# Patient Record
Sex: Female | Born: 1980 | Race: White | Hispanic: Yes | Marital: Married | Smoking: Never smoker
Health system: Southern US, Community
[De-identification: ages and names within clinical notes are randomized; demographics above are authoritative.]

## PROBLEM LIST (undated history)

## (undated) DIAGNOSIS — E119 Type 2 diabetes mellitus without complications: Secondary | ICD-10-CM

## (undated) DIAGNOSIS — O139 Gestational [pregnancy-induced] hypertension without significant proteinuria, unspecified trimester: Secondary | ICD-10-CM

## (undated) DIAGNOSIS — N938 Other specified abnormal uterine and vaginal bleeding: Secondary | ICD-10-CM

## (undated) DIAGNOSIS — O24419 Gestational diabetes mellitus in pregnancy, unspecified control: Secondary | ICD-10-CM

## (undated) DIAGNOSIS — R87613 High grade squamous intraepithelial lesion on cytologic smear of cervix (HGSIL): Secondary | ICD-10-CM

## (undated) HISTORY — DX: Other specified abnormal uterine and vaginal bleeding: N93.8

## (undated) HISTORY — DX: Type 2 diabetes mellitus without complications: E11.9

## (undated) HISTORY — DX: High grade squamous intraepithelial lesion on cytologic smear of cervix (HGSIL): R87.613

## (undated) HISTORY — PX: NO PAST SURGERIES: SHX2092

---

## 2001-12-23 ENCOUNTER — Ambulatory Visit (HOSPITAL_COMMUNITY): Admission: RE | Admit: 2001-12-23 | Discharge: 2001-12-23 | Payer: Self-pay | Admitting: *Deleted

## 2001-12-23 ENCOUNTER — Encounter: Payer: Self-pay | Admitting: *Deleted

## 2001-12-24 ENCOUNTER — Inpatient Hospital Stay (HOSPITAL_COMMUNITY): Admission: AD | Admit: 2001-12-24 | Discharge: 2001-12-24 | Payer: Self-pay | Admitting: *Deleted

## 2001-12-31 ENCOUNTER — Encounter (HOSPITAL_COMMUNITY): Admission: AD | Admit: 2001-12-31 | Discharge: 2002-01-09 | Payer: Self-pay | Admitting: Obstetrics and Gynecology

## 2002-01-06 ENCOUNTER — Encounter: Payer: Self-pay | Admitting: *Deleted

## 2002-01-13 ENCOUNTER — Inpatient Hospital Stay (HOSPITAL_COMMUNITY): Admission: AD | Admit: 2002-01-13 | Discharge: 2002-01-18 | Payer: Self-pay | Admitting: *Deleted

## 2005-11-27 ENCOUNTER — Inpatient Hospital Stay (HOSPITAL_COMMUNITY): Admission: AD | Admit: 2005-11-27 | Discharge: 2005-11-30 | Payer: Self-pay | Admitting: Obstetrics

## 2006-07-14 ENCOUNTER — Emergency Department (HOSPITAL_COMMUNITY): Admission: EM | Admit: 2006-07-14 | Discharge: 2006-07-14 | Payer: Self-pay | Admitting: Emergency Medicine

## 2010-06-05 ENCOUNTER — Emergency Department (HOSPITAL_COMMUNITY)
Admission: EM | Admit: 2010-06-05 | Discharge: 2010-06-06 | Disposition: A | Discharge status: Home / Self Care | Payer: Self-pay | Attending: Emergency Medicine | Admitting: Emergency Medicine

## 2010-06-05 DIAGNOSIS — N898 Other specified noninflammatory disorders of vagina: Secondary | ICD-10-CM | POA: Insufficient documentation

## 2010-06-05 DIAGNOSIS — R109 Unspecified abdominal pain: Secondary | ICD-10-CM | POA: Insufficient documentation

## 2010-06-05 LAB — POCT I-STAT, CHEM 8
Glucose, Bld: 152 mg/dL — ABNORMAL HIGH (ref 70–99)
HCT: 39 % (ref 36.0–46.0)
Hemoglobin: 13.3 g/dL (ref 12.0–15.0)
Potassium: 3.7 mEq/L (ref 3.5–5.1)
Sodium: 139 mEq/L (ref 135–145)
TCO2: 23 mmol/L (ref 0–100)

## 2010-06-06 LAB — CBC
HCT: 36.4 % (ref 36.0–46.0)
MCH: 27.4 pg (ref 26.0–34.0)
MCHC: 33 g/dL (ref 30.0–36.0)
MCV: 83.1 fL (ref 78.0–100.0)
RDW: 14 % (ref 11.5–15.5)
WBC: 12.6 10*3/uL — ABNORMAL HIGH (ref 4.0–10.5)

## 2010-06-06 LAB — URINALYSIS, ROUTINE W REFLEX MICROSCOPIC
Nitrite: POSITIVE — AB
Protein, ur: 100 mg/dL — AB
Specific Gravity, Urine: 1.013 (ref 1.005–1.030)
Urobilinogen, UA: 0.2 mg/dL (ref 0.0–1.0)

## 2010-06-06 LAB — DIFFERENTIAL
Eosinophils Absolute: 0.2 10*3/uL (ref 0.0–0.7)
Eosinophils Relative: 2 % (ref 0–5)
Lymphocytes Relative: 22 % (ref 12–46)
Lymphs Abs: 2.8 10*3/uL (ref 0.7–4.0)
Monocytes Absolute: 0.8 10*3/uL (ref 0.1–1.0)
Monocytes Relative: 6 % (ref 3–12)

## 2010-06-06 LAB — URINE MICROSCOPIC-ADD ON

## 2010-06-06 LAB — WET PREP, GENITAL: Yeast Wet Prep HPF POC: NONE SEEN

## 2010-06-16 ENCOUNTER — Emergency Department (HOSPITAL_COMMUNITY)
Admission: EM | Admit: 2010-06-16 | Discharge: 2010-06-17 | Disposition: A | Discharge status: Home / Self Care | Payer: Self-pay | Attending: Emergency Medicine | Admitting: Emergency Medicine

## 2010-06-16 DIAGNOSIS — R109 Unspecified abdominal pain: Secondary | ICD-10-CM | POA: Insufficient documentation

## 2010-06-16 DIAGNOSIS — R42 Dizziness and giddiness: Secondary | ICD-10-CM | POA: Insufficient documentation

## 2010-06-16 DIAGNOSIS — N898 Other specified noninflammatory disorders of vagina: Secondary | ICD-10-CM | POA: Insufficient documentation

## 2010-06-16 DIAGNOSIS — M545 Low back pain, unspecified: Secondary | ICD-10-CM | POA: Insufficient documentation

## 2010-06-16 LAB — URINE MICROSCOPIC-ADD ON

## 2010-06-16 LAB — WET PREP, GENITAL
WBC, Wet Prep HPF POC: NONE SEEN
Yeast Wet Prep HPF POC: NONE SEEN

## 2010-06-16 LAB — URINALYSIS, ROUTINE W REFLEX MICROSCOPIC
Bilirubin Urine: NEGATIVE
Glucose, UA: NEGATIVE mg/dL
Protein, ur: 30 mg/dL — AB

## 2010-06-16 LAB — CBC
HCT: 36.1 % (ref 36.0–46.0)
MCH: 27.5 pg (ref 26.0–34.0)
MCV: 82.8 fL (ref 78.0–100.0)
RDW: 14.1 % (ref 11.5–15.5)
WBC: 15.9 10*3/uL — ABNORMAL HIGH (ref 4.0–10.5)

## 2010-06-17 ENCOUNTER — Emergency Department (HOSPITAL_COMMUNITY): Payer: Self-pay

## 2014-10-08 ENCOUNTER — Encounter (HOSPITAL_COMMUNITY): Payer: Self-pay | Admitting: Emergency Medicine

## 2014-10-08 ENCOUNTER — Other Ambulatory Visit (HOSPITAL_COMMUNITY)
Admission: RE | Admit: 2014-10-08 | Discharge: 2014-10-08 | Disposition: A | Discharge status: Home / Self Care | Payer: No Typology Code available for payment source | Source: Ambulatory Visit | Attending: Family Medicine | Admitting: Family Medicine

## 2014-10-08 ENCOUNTER — Emergency Department (HOSPITAL_COMMUNITY)
Admission: EM | Admit: 2014-10-08 | Discharge: 2014-10-08 | Disposition: A | Discharge status: Home / Self Care | Payer: No Typology Code available for payment source | Source: Home / Self Care

## 2014-10-08 DIAGNOSIS — N938 Other specified abnormal uterine and vaginal bleeding: Secondary | ICD-10-CM

## 2014-10-08 DIAGNOSIS — Z113 Encounter for screening for infections with a predominantly sexual mode of transmission: Secondary | ICD-10-CM | POA: Insufficient documentation

## 2014-10-08 DIAGNOSIS — N760 Acute vaginitis: Secondary | ICD-10-CM | POA: Insufficient documentation

## 2014-10-08 LAB — POCT URINALYSIS DIP (DEVICE)
Bilirubin Urine: NEGATIVE
Glucose, UA: NEGATIVE mg/dL
Ketones, ur: NEGATIVE mg/dL
Leukocytes, UA: NEGATIVE
NITRITE: NEGATIVE
PH: 8.5 — AB (ref 5.0–8.0)
PROTEIN: NEGATIVE mg/dL
Specific Gravity, Urine: 1.015 (ref 1.005–1.030)
UROBILINOGEN UA: 0.2 mg/dL (ref 0.0–1.0)

## 2014-10-08 LAB — POCT I-STAT, CHEM 8
BUN: 9 mg/dL (ref 6–20)
CALCIUM ION: 1.2 mmol/L (ref 1.12–1.23)
Chloride: 105 mmol/L (ref 101–111)
Creatinine, Ser: 0.7 mg/dL (ref 0.44–1.00)
GLUCOSE: 87 mg/dL (ref 65–99)
HEMATOCRIT: 39 % (ref 36.0–46.0)
Hemoglobin: 13.3 g/dL (ref 12.0–15.0)
POTASSIUM: 4.1 mmol/L (ref 3.5–5.1)
SODIUM: 141 mmol/L (ref 135–145)
TCO2: 24 mmol/L (ref 0–100)

## 2014-10-08 LAB — POCT PREGNANCY, URINE: PREG TEST UR: NEGATIVE

## 2014-10-08 MED ORDER — MEGESTROL ACETATE 20 MG PO TABS: ORAL_TABLET | ORAL | Status: DC

## 2014-10-08 NOTE — ED Notes (Signed)
Pt reports vaginal bleeding onset 1 month associated w/abd pain Reports she saw her PCP and was given x10 pills of Medroxyprogesterone w/no relief Denies fevers, urinary sx Alert, on signs of acute distress.

## 2014-10-08 NOTE — Discharge Instructions (Signed)
Sangrado uterino anormal °(Abnormal Uterine Bleeding) °Sangrado uterino anormal significa que hay un sangrado por la vagina que no es su período menstrual normal. Puede ser: °· Pérdidas de sangre o hemorragias entre los períodos. °· Hemorragias luego de tener sexo (relaciones sexuales). °· Sangrado abundante o más que lo habitual. °· Períodos que duran más que lo normal. °· Sangrado luego de la menopausia. °Hay muchos problemas que pueden ser la causa. El tratamiento dependerá de la causa del sangrado. Cualquier tipo de sangrado que no sea normal debe consultarse con el médico.  °CUIDADOS EN EL HOGAR °Controle su afección para ver si hay cambios. Estas indicaciones podrán disminuir cualquier molestia que tenga: °· No use tampones ni duchas vaginales o como le haya indicado el médico. °· Cambie los apósitos con frecuencia. °Deberá hacerse exámenes pélvicos regulares y pruebas de Papanicolaou. Realice los estudios indicados según le indique su médico. °SOLICITE AYUDA SI: °· El sangrado dura más de 1 semana. °· Se siente mareada por momentos. °SOLICITE AYUDA DE INMEDIATO SI:  °· Se desmaya. °· Tiene que cambiarse los apósitos cada 15 a 30 minutos. °· Siente dolor en el abdomen. °· Tiene fiebre. °· Se siente débil o presenta sudoración. °· Elimina coágulos grandes por la vagina. °· Siente malestar estomacal (náuseas) y devuelve (vomita). °ASEGÚRESE DE QUE: °· Comprende estas instrucciones. °· Controlará su afección. °· Recibirá ayuda de inmediato si no mejora o si empeora. °Document Released: 04/28/2010 Document Revised: 03/31/2013 °ExitCare® Patient Information ©2015 ExitCare, LLC. This information is not intended to replace advice given to you by your health care provider. Make sure you discuss any questions you have with your health care provider. ° °

## 2014-10-08 NOTE — ED Provider Notes (Signed)
CSN: [id number]     Arrival date & time 10/08/14  1437 History   None    Chief Complaint  Patient presents with  . Vaginal Bleeding   (Consider location/radiation/quality/duration/timing/severity/associated sxs/prior Treatment)  HPI   The patient is a non-English-speaking 34 year old female presenting tonight with complaints of persistent vaginal bleeding for the past month. Patient states she saw her primary care provider for which they provided her 10 days of methylprogesterone which was ineffective. Patient states she has some generalized lower abdominal discomfort with aching of her breasts bilaterally. Denies any fever, fatigue, nausea, vomiting, or diarrhea. Patient states she had a similar episode in 2012 for which she was hospitalized.  History reviewed. No pertinent past medical history. History reviewed. No pertinent past surgical history. No family history on file. History  Substance Use Topics  . Smoking status: Never Smoker   . Smokeless tobacco: Not on file  . Alcohol Use: No   OB History    No data available     Review of Systems  Constitutional: Negative.  Negative for fever, chills, fatigue and unexpected weight change.  HENT: Negative.   Eyes: Negative.   Respiratory: Negative.   Cardiovascular: Negative.   Gastrointestinal: Positive for abdominal pain. Negative for nausea, vomiting, diarrhea, constipation, blood in stool, abdominal distention, anal bleeding and rectal pain.  Endocrine: Negative.   Genitourinary: Positive for vaginal bleeding and pelvic pain. Negative for dysuria, frequency, flank pain, vaginal discharge, difficulty urinating, vaginal pain and dyspareunia.  Musculoskeletal: Negative.   Skin: Negative.  Negative for color change, pallor, rash and wound.  Allergic/Immunologic: Negative.   Neurological: Negative.  Negative for dizziness.  Hematological: Negative.  Does not bruise/bleed easily.  Psychiatric/Behavioral: Negative.     Allergies   Review of patient's allergies indicates no known allergies.  Home Medications   Prior to Admission medications   Medication Sig Start Date End Date Taking? Authorizing Provider  megestrol (MEGACE) 20 MG tablet Take two 20mg  tablets every morning and two 20mg  tablets every evening for uterine bleeding. (Four 20mg  tablets daily). 10/08/14   Servando Salinaatherine H Rossi, NP   BP 110/73 mmHg  Pulse 76  Temp(Src) 98.4 F (36.9 C) (Oral)  Resp 16  SpO2 100%   Physical Exam  Constitutional: She is oriented to person, place, and time. She appears well-developed and well-nourished. No distress.  Cardiovascular: Normal rate, regular rhythm, normal heart sounds and intact distal pulses.  Exam reveals no gallop and no friction rub.   No murmur heard. Pulmonary/Chest: Effort normal and breath sounds normal. No respiratory distress. She has no wheezes. She has no rales. She exhibits no tenderness.  Abdominal: Soft. Bowel sounds are normal. She exhibits no distension and no mass. There is tenderness. There is no rebound and no guarding. Hernia confirmed negative in the right inguinal area and confirmed negative in the left inguinal area.  Genitourinary: Uterus normal. There is breast tenderness. No breast swelling, discharge or bleeding. No labial fusion. There is no rash, tenderness, lesion or injury on the right labia. There is no rash, tenderness, lesion or injury on the left labia. Cervix exhibits no motion tenderness and no friability. There is bleeding in the vagina. No erythema or tenderness in the vagina. No foreign body around the vagina. No signs of injury around the vagina. No vaginal discharge found.  Moderate amount of vaginal bleeding present during speculum examination. Verified bleeding from cervical os.  Patient very tense during speculum examination and had difficulty relaxing. Negative for cervical motion  tenderness; reports mild abdominal discomfort throughout examination.  Lymphadenopathy:        Right: No inguinal adenopathy present.       Left: No inguinal adenopathy present.  Neurological: She is alert and oriented to person, place, and time.  Skin: She is not diaphoretic.  Nursing note and vitals reviewed.   ED Course  Procedures (including critical care time) Labs Review Labs Reviewed  POCT URINALYSIS DIP (DEVICE) - Abnormal; Notable for the following:    Hgb urine dipstick LARGE (*)    pH 8.5 (*)    All other components within normal limits  POCT PREGNANCY, URINE  I-STAT CHEM 8, ED  POCT I-STAT, CHEM 8  CERVICOVAGINAL ANCILLARY ONLY   Results for orders placed or performed during the hospital encounter of 10/08/14  POCT urinalysis dip (device)  Result Value Ref Range   Glucose, UA NEGATIVE NEGATIVE mg/dL   Bilirubin Urine NEGATIVE NEGATIVE   Ketones, ur NEGATIVE NEGATIVE mg/dL   Specific Gravity, Urine 1.015 1.005 - 1.030   Hgb urine dipstick LARGE (A) NEGATIVE   pH 8.5 (H) 5.0 - 8.0   Protein, ur NEGATIVE NEGATIVE mg/dL   Urobilinogen, UA 0.2 0.0 - 1.0 mg/dL   Nitrite NEGATIVE NEGATIVE   Leukocytes, UA NEGATIVE NEGATIVE  Pregnancy, urine POC  Result Value Ref Range   Preg Test, Ur NEGATIVE NEGATIVE  I-STAT, chem 8  Result Value Ref Range   Sodium 141 135 - 145 mmol/L   Potassium 4.1 3.5 - 5.1 mmol/L   Chloride 105 101 - 111 mmol/L   BUN 9 6 - 20 mg/dL   Creatinine, Ser 4.09 0.44 - 1.00 mg/dL   Glucose, Bld 87 65 - 99 mg/dL   Calcium, Ion 8.11 1.12 - 1.23 mmol/L   TCO2 24 0 - 100 mmol/L   Hemoglobin 13.3 12.0 - 15.0 g/dL   HCT 91.4 78.2 - 95.6 %    Imaging Review No results found.  Spoke with Alphonzo Severance PAC at [facility] to discuss plan of care and follow up.  Patient started on recommended  BID megace and scheduled for a follow up appointment at [facility] for 2pm on Wednesday, July 6th, 2016.  MDM   1. Dysfunctional uterine bleeding    Meds ordered this encounter  Medications  . megestrol (MEGACE) 20 MG tablet     Sig: Take two  tablets every morning and two  tablets every evening for uterine bleeding. (Four  tablets daily).    Dispense:  120 tablet    Refill:  0   Discussed plan of care with patient and importance of follow up care.  The patient verbalizes understanding and agrees to plan of care.       Servando Salina, NP 10/08/14 [phone]

## 2014-10-12 LAB — CERVICOVAGINAL ANCILLARY ONLY
Chlamydia: NEGATIVE
NEISSERIA GONORRHEA: NEGATIVE

## 2014-10-13 ENCOUNTER — Ambulatory Visit (INDEPENDENT_AMBULATORY_CARE_PROVIDER_SITE_OTHER): Payer: Self-pay | Admitting: Family Medicine

## 2014-10-13 ENCOUNTER — Encounter: Payer: Self-pay | Admitting: Family Medicine

## 2014-10-13 VITALS — BP 125/75 | HR 85 | Temp 98.4°F | Ht 59.5 in | Wt 192.8 lb

## 2014-10-13 DIAGNOSIS — N939 Abnormal uterine and vaginal bleeding, unspecified: Secondary | ICD-10-CM

## 2014-10-13 HISTORY — DX: Abnormal uterine and vaginal bleeding, unspecified: N93.9

## 2014-10-13 LAB — CBC
HCT: 37.7 % (ref 36.0–46.0)
HEMOGLOBIN: 12.1 g/dL (ref 12.0–15.0)
MCH: 26.1 pg (ref 26.0–34.0)
MCHC: 32.1 g/dL (ref 30.0–36.0)
MCV: 81.3 fL (ref 78.0–100.0)
MPV: 11 fL (ref 8.6–12.4)
PLATELETS: 370 10*3/uL (ref 150–400)
RBC: 4.64 MIL/uL (ref 3.87–5.11)
RDW: 14.5 % (ref 11.5–15.5)
WBC: 12.7 10*3/uL — ABNORMAL HIGH (ref 4.0–10.5)

## 2014-10-13 LAB — CERVICOVAGINAL ANCILLARY ONLY: Wet Prep (BD Affirm): NEGATIVE

## 2014-10-13 LAB — TSH: TSH: 3.753 u[IU]/mL (ref 0.350–4.500)

## 2014-10-13 MED ORDER — MEDROXYPROGESTERONE ACETATE 10 MG PO TABS: 10.0000 mg | ORAL_TABLET | Freq: Every day | ORAL | Status: DC

## 2014-10-13 NOTE — Patient Instructions (Addendum)
Sangrado uterino anormal (Abnormal Uterine Bleeding) El sangrado uterino anormal puede afectar a las mujeres que estn en diversas etapas de la vida, desde adolescentes, mujeres frtiles y Probation officermujeres embarazadas, hasta mujeres que han llegado a la menopausia. Hay diversas clases de sangrado uterino que se consideran anormales, entre ellas:  Prdidas de sangre o Nationwide Mutual Insurancehemorragias entre los perodos.  Hemorragias luego de Sales promotion account executivemantener relaciones sexuales.  Sangrado abundante o ms que lo habitual.  Perodos que duran ms que lo normal.  Sangrado luego de la menopausia. Muchos casos de sangrado uterino anormal son leves y simples de tratar, mientras que otros son ms graves. El mdico debe evaluar cualquier clase de sangrado anormal. El tratamiento depender de la causa del sangrado. INSTRUCCIONES PARA EL CUIDADO EN EL HOGAR Controle su afeccin para ver si hay cambios. Las siguientes indicaciones ayudarn a Architectural technologistaliviar cualquier molestia que pueda sentir:  Evite las duchas vaginales y el uso de tampones segn las indicaciones del mdico.  Cmbiese las compresas con frecuencia. Deber hacerse exmenes plvicos regulares y pruebas de Papanicolaou. Cumpla con todas las visitas de control y Ciscoexmanes diagnsticos, segn le indique su mdico.  SOLICITE ATENCIN MDICA SI:   El sangrado dura ms de 1 semana.  Se siente mareada por momentos. SOLICITE ATENCIN MDICA DE INMEDIATO SI:   Se desmaya.  Debe cambiarse la compresa cada 15 a 30 minutos.  Siente dolor abdominal.  Lance Mussiene fiebre.  Se siente dbil o presenta sudoracin.  Elimina cogulos grandes por la vagina.  Comienza a sentir nuseas y vomita. ASEGRESE DE QUE:   Comprende estas instrucciones.  Controlar su afeccin.  Recibir ayuda de inmediato si no mejora o si empeora. Document Released: 03/26/2005 Document Revised: 03/31/2013 [facility] Patient Information 2015 [place]ExitCare, [place]LLC. This information is not intended to replace advice given  to you by your health care provider. Make sure you discuss any questions you have with your health care provider. Sndrome ovrico poliqustico (Polycystic Ovarian Syndrome) El sndrome ovrico poliqustico (PCOS) es un trastorno hormonal comn en mujeres en edad reproductiva. La mayora de las mujeres con este sndrome desarrolla pequeos quistes en sus ovarios. Esto puede ocasionar problemas en la menstruacin y dificultades para quedar embarazada. Tambin puede aumentar el riesgo de aborto espontneo. Si no se trata, el sndrome ovrico poliqustico puede acarrear graves problemas de Cobresalud, como diabetes y enfermedades del Programmer, multimediacorazn. CAUSAS La causa de este sndrome no se conoce, pero podra haber un factor gentico. SIGNOS Y SNTOMAS  Perodos menstruales irregulares o ausentes.  Incapacidad para quedar embarazada (infertilidad) debido a la falta de ovulacin  Aumento del crecimiento del vello en el rostro, el trax, el [address], la espalda, los muslos o los dedos de los pies.  Acn, piel grasa o caspa.  Dolor plvico.  Ganancia de peso u obesidad, por lo general, sobrepeso localizado alrededor de la cintura.  Diabetes tipo 2.  Colesterol elevado.  Hipertensin arterial.  Calvicie femenina o cabello muy fino.  Manchas de piel gruesa marrn oscura o negra en el cuello, brazos, pechos o caderas.  Pequeos excesos de piel suelta (plipos cutneos) en las axilas o en el pecho.  Ronquidos excesivos e interrupcin de la respiracin durante el sueo (apnea del sueo).  Tonalidad grave de Actuaryla voz.  Diabetes gestacional durante el embarazo.  DIAGNSTICO  No hay una nica prueba para diagnosticar el sndrome ovrico poliqustico.  El profesional que lo asiste:  Education officer, environmentalealizar una historia clnica.  Realizar un examen plvico  Realizar una prueba de Wheelerultrasonido.  Controlar sus niveles de  hormonas femeninas y masculinas.  Medir la glucosa o los niveles de Production assistant, radio.   Realizar otros anlisis de [place].  Si est produciendo demasiadas hormonas masculinas, el mdico se asegurar de que se debe al sndrome ovrico poliqustico. Durante el examen fsico, el mdico evaluar las zonas de aumento de vello. Permita que se Educational psychologist natural del vello [employer] previos a la visita.  Durante el examen plvico, los ovarios podrn agrandarse o hincharse debido al aumento del nmero de pequeos quistes. Esto puede observarse ms fcilmente mediante la utilizacin de ultrasonido vaginal para examinar los ovarios y las paredes del tero (endometrio) en busca de quistes. Las paredes del tero pueden engrosarse si no tiene un perodo menstrual regular.  TRATAMIENTO  Debido a que no hay cura para el sndrome ovrico poliqustico, debe controlarse para Physiological scientist. Los tratamientos se basan en los sntomas. Tambin se basa en su deseo de tener un beb o usar anticonceptivos.  El tratamiento puede incluir:  Hormona progesterona, para iniciar un periodo menstrual.  Pldoras anticonceptivas, para Orthoptist.  Medicamentos para estimular la ovulacin, si quiere quedar embarazada.  Medicamentos para Air traffic controller.  Medicamentos para Scientist, physiological presin arterial.  Medicamentos y dietas para Chief Operating Officer los altos niveles de colesterol y triglicridos en la sangre. Medicamentos para reducir el crecimiento excesivo de vello. Ciruga, realizando pequeos cortes en el ovario, para disminuir la produccin de hormona masculina. Esto se realiza a travs de un tubo largo que ilumina (laparoscopio) que se coloca en la pelvis a travs de una pequea incisin en la zona inferior del abdomen.  INSTRUCCIONES PARA EL CUIDADO EN EL HOGAR Tome slo medicamentos de venta libre o recetados, segn las indicaciones del mdico. Preste atencin a su alimentacin y a sus niveles de actividad fsica. Esto puede ayudar a Web designer  del sndrome ovrico poliqustico. Controle su [place]. Consuma alimentos bajos en carbohidratos y [address] en contenido de [place]. Haga ejercicios regularmente. SOLICITE ATENCIN MDICA SI: Los sntomas no mejoran con los [employer]. Aparecen nuevos sntomas. Document Released: 07/12/2008 Document Revised: 01/14/2013 [facility] Patient Information 2015 [place], [place]. This information is not intended to replace advice given to you by your health care provider. Make sure you discuss any questions you have with your health care provider.

## 2014-10-13 NOTE — Progress Notes (Signed)
Used Engineer, maintenance (IT)nterpreter Dorita.  States had normal period in March. Then was spotting. Went to a clinic, gave her provera. For 5 days , bleeding stopped. Then bleeding came back, went back to clinic gave her provera 10 days, then bleeding stopped, then bleeding came back and went to Urgent care and gave her megace [facility]

## 2014-10-13 NOTE — Progress Notes (Signed)
    Subjective:    Patient ID: Debbie Strickland is a 34 y.o. female presenting with Follow-up  on 10/13/2014  HPI: Has been to the primary MD for abnormal bleeding and was given Provera x 5 d, then x 10 day and then seen in urgent care.  Cycle lasted for 1 month. Cycle were not normal before this.  She sometimes skips 2-4 months between cycles. Is not actively trying to have children, but would not mind having more children.  Review of Systems  Constitutional: Negative for fever and chills.  Respiratory: Negative for shortness of breath.   Cardiovascular: Negative for chest pain.  Gastrointestinal: Negative for nausea, vomiting and abdominal pain.  Genitourinary: Negative for dysuria.  Skin: Negative for rash.      Objective:    BP 125/75 mmHg  Pulse 85  Temp(Src) 98.4 F (36.9 C)  Ht 4' 11.5" (1.511 m)  Wt 192 lb 12.8 oz (87.454 kg)  BMI 38.30 kg/m2  LMP  Physical Exam  Constitutional: She is oriented to person, place, and time. She appears well-developed and well-nourished. No distress.  HENT:  Head: Normocephalic and atraumatic.  Eyes: No scleral icterus.  Neck: Neck supple.  Cardiovascular: Normal rate.   Pulmonary/Chest: Effort normal.  Abdominal: Soft.  Genitourinary:  BUS normal, vagina is pink and rugated, cervix is nulliparous without lesion, uterus is small and anteverted, no adnexal mass or tenderness.   Neurological: She is alert and oriented to person, place, and time.  Skin: Skin is warm and dry.  Psychiatric: She has a normal mood and affect.        Assessment & Plan:   Problem List Items Addressed This Visit      Unprioritized   Abnormal uterine bleeding - Primary    Probably anovulatory.  Will cycle with provera monthly.  If desires attempts at pregnancy, would need to schedule infertility visit.      Relevant Medications   medroxyPROGESTERone (PROVERA) 10 MG tablet   Other Relevant Orders   CBC   TSH   Prolactin   hCG, quantitative,  pregnancy   US Pelvis Complete   US Transvaginal Non-OB        Madolin Twaddle S 10/13/2014 2:53 PM

## 2014-10-13 NOTE — Assessment & Plan Note (Signed)
Probably anovulatory.  Will cycle with provera monthly.  If desires attempts at pregnancy, would need to schedule infertility visit.

## 2014-10-14 ENCOUNTER — Telehealth: Payer: Self-pay | Admitting: *Deleted

## 2014-10-14 LAB — HCG, QUANTITATIVE, PREGNANCY: hCG, Beta Chain, Quant, S: <2 m[IU]/mL

## 2014-10-14 LAB — PROLACTIN: PROLACTIN: 10.9 ng/mL

## 2014-10-14 NOTE — ED Notes (Signed)
negative findings. No further action required

## 2014-10-14 NOTE — Telephone Encounter (Signed)
Contacted patient with interpreter Pearletha AlfredMaria Elena Strickland, pt has questions concerning Provera.  Informed patient that she is to start taking the medication on 8/1 for 10 days and then repeat the 1st of each month.  Pt verbalizes understanding.

## 2014-10-14 NOTE — Telephone Encounter (Signed)
Received voicemail message on nurse line today at [phone].  Patient states she has a question about her prescription.  Requests a call back.

## 2014-10-20 ENCOUNTER — Ambulatory Visit (HOSPITAL_COMMUNITY)
Admission: RE | Admit: 2014-10-20 | Discharge: 2014-10-20 | Disposition: A | Discharge status: Home / Self Care | Payer: No Typology Code available for payment source | Source: Ambulatory Visit | Attending: Family Medicine | Admitting: Family Medicine

## 2014-10-20 DIAGNOSIS — N939 Abnormal uterine and vaginal bleeding, unspecified: Secondary | ICD-10-CM | POA: Insufficient documentation

## 2014-11-09 ENCOUNTER — Telehealth: Payer: Self-pay | Admitting: General Practice

## 2014-11-09 NOTE — Telephone Encounter (Signed)
Patient called and left message stating she has a question and would like a callback. Called patient with Debbie Strickland for interpreter, patient states she doesn't know which medication she is supposed to take and how and thinks there are two medications. Told patient she is only supposed to take the medroxyprogesterone or Provera not the megace. Told patient she is supposed to start the provera on the 1st of the month and take the medication once a day for 10 days then the 1st of September she would start the medication again for 10 days. Patient verbalized understanding and asked for how many months should she do this. Told patient through November. Patient verbalized understanding and had no other questions

## 2015-11-01 ENCOUNTER — Ambulatory Visit (INDEPENDENT_AMBULATORY_CARE_PROVIDER_SITE_OTHER): Payer: Self-pay | Admitting: Family Medicine

## 2015-11-01 ENCOUNTER — Other Ambulatory Visit (HOSPITAL_COMMUNITY)
Admission: RE | Admit: 2015-11-01 | Discharge: 2015-11-01 | Disposition: A | Discharge status: Home / Self Care | Payer: No Typology Code available for payment source | Source: Ambulatory Visit | Attending: Family Medicine | Admitting: Family Medicine

## 2015-11-01 ENCOUNTER — Encounter: Payer: Self-pay | Admitting: Family Medicine

## 2015-11-01 VITALS — BP 124/57 | HR 90 | Wt 197.7 lb

## 2015-11-01 DIAGNOSIS — N939 Abnormal uterine and vaginal bleeding, unspecified: Secondary | ICD-10-CM | POA: Insufficient documentation

## 2015-11-01 LAB — CBC
HEMATOCRIT: 36.3 % (ref 35.0–45.0)
HEMOGLOBIN: 11.7 g/dL (ref 11.7–15.5)
MCH: 26.4 pg — AB (ref 27.0–33.0)
MCHC: 32.2 g/dL (ref 32.0–36.0)
MCV: 81.9 fL (ref 80.0–100.0)
MPV: 10.9 fL (ref 7.5–12.5)
Platelets: 321 10*3/uL (ref 140–400)
RBC: 4.43 MIL/uL (ref 3.80–5.10)
RDW: 14.7 % (ref 11.0–15.0)
WBC: 11.2 10*3/uL — ABNORMAL HIGH (ref 3.8–10.8)

## 2015-11-01 LAB — POCT PREGNANCY, URINE: PREG TEST UR: NEGATIVE

## 2015-11-01 MED ORDER — MEDROXYPROGESTERONE ACETATE 10 MG PO TABS: 10.0000 mg | ORAL_TABLET | Freq: Every day | ORAL | 3 refills | Status: DC

## 2015-11-01 NOTE — Progress Notes (Signed)
CLINIC ENCOUNTER NOTE  History:  35 y.o. [id number] here today for vaginal bleeding  Completed treatment given of Provera but did not continue monthly.  Menses normal for 1 month. Missed menses for about 4 months. +cramping, 5-6/10 pain. As a teenager, had monthly menses.  Quantity: Every half an hour has to change pad because soaked. When wakes in the morning, has gushes of blood. +Clots size of a quarters.  Length: 15 days.    Past Medical History:  Diagnosis Date  . DUB (dysfunctional uterine bleeding)     No past surgical history on file.  The following portions of the patient's history were reviewed and updated as appropriate: allergies, current medications, past family history, past medical history, past social history, past surgical history and problem list.     Review of Systems:  See above; comprehensive review of systems was otherwise negative.  Objective:  Physical Exam BP (!) 124/57 (BP Location: Left Arm, Patient Position: Sitting, Cuff Size: Large)   Pulse 90   Wt 197 lb 11.2 oz (89.7 kg)   LMP 10/15/2015   BMI 39.26 kg/m  CONSTITUTIONAL: Well-developed, well-nourished female in no acute distress.  HENT:  Normocephalic, atraumatic SKIN: Skin is warm and dry.  NEUROLGIC: Alert  PSYCHIATRIC: Normal mood and affect.  CARDIOVASCULAR: Normal heart rate noted RESPIRATORY: Effort and breath sounds normal, no problems with respiration noted ABDOMEN: Soft, no distention noted.  No tenderness, rebound or guarding.  PELVIC:Normal appearing external genitalia; normal appearing vaginal mucosa and cervix. Dark red blood in posterior fornix, about 5cc, coming from cervical os.  Normal uterine size, no other palpable masses, no uterine or adnexal tenderness.   Labs and Imaging No results found.      Assessment & Plan:    Routine preventative health maintenance measures emphasized.   1. Abnormal uterine bleeding - CBC - medroxyPROGESTERone (PROVERA) 10 MG tablet;  Take 1 tablet (10 mg total) by mouth daily. Take 1 tablet PO for 14 days, then off 14 days. Then repeat the same course.  Dispense: 28 tablet; Refill: 3 - Surgical pathology - To return for IUD placement - EMB performed today    Cleda Clarks, DO OB/GYN Fellow [facility] [employer], [facility]

## 2015-11-01 NOTE — Patient Instructions (Signed)
Informacin sobre el dispositivo intrauterino (Intrauterine Device Information) Un dispositivo intrauterino (DIU) se inserta en el tero e impide el embarazo. Hay dos tipos de DIU:   DIU de cobre: este tipo de DIU est recubierto con un alambre de cobre y se inserta dentro del tero. El cobre hace que el tero y las trompas de [place] produzcan un liquido que [employer] espermatozoides. El DIU de cobre puede Geneticist, molecular durante 10 aos.  DIU con hormona: este tipo de DIU contiene la hormona progestina (progesterona sinttica). Las hormonas hacen que el moco cervical se haga ms espeso, lo que evita que el esperma ingrese al tero. Tambin hace que la membrana que recubre internamente al tero sea ms delgada lo que impide el implante del vulo fertilizado. La hormona debilita o destruye los espermatozoides que ingresan al tero. Alguno de los tipos de DIU hormonal pueden Geneticist, molecular durante 5 aos y otros tipos pueden dejarse en el lugar por 3 aos. El mdico se asegurar de que usted sea una buena candidata para usar el DIU. Converse con su mdico acerca de los posibles efectos secundarios.  VENTAJAS DEL DISPOSITIVO INTRAUTERINO  El DIU es muy eficaz, reversible, de accin prolongada y de bajo mantenimiento.  No hay efectos secundarios relacionados con el estrgeno.  El DIU puede ser utilizado durante la Market researcher.  No est asociado con el aumento de [place].  Funciona inmediatamente despus de la insercin.  El DIU hormonal funciona inmediatamente si se inserta dentro de los [address] del inicio del perodo. Ser necesario que utilice un mtodo anticonceptivo adicional durante 7 das si el DIU hormonal se inserta en algn otro momento del ciclo.  El DIU de cobre no interfiere con las hormonas femeninas.  El DIU hormonal puede hacer que los perodos menstruales abundantes se hagan ms ligeros y que haya menos clicos.  El DIU hormonal puede usarse durante 3 a 5  aos.  El DIU de cobre puede usarse durante 10 aos. DESVENTAJAS DEL DISPOSITIVO INTRAUTERINO  El DIU hormonal puede estar asociado con patrones de sangrado irregular.  El DIU de cobre puede hacer que el flujo menstrual ms abundante y doloroso.  Puede experimentar clicos y sangrado vaginal despus de la insercin.   Esta informacin no tiene Theme park manager el consejo del mdico. Asegrese de hacerle al mdico cualquier pregunta que tenga.   Document Released: 09/13/2009 Document Revised: 11/26/2012 Elsevier Interactive Patient Education 2016 Elsevier Inc. Heron Nay uterino anormal (Abnormal Uterine Bleeding) Sangrado uterino anormal significa que hay un sangrado por la vagina que no es su perodo menstrual normal. Puede ser:  Prdidas de sangre o hemorragias entre los perodos.  Hemorragias luego de Warehouse manager sexo [employer]).  Sangrado abundante o ms que lo habitual.  Perodos que duran ms que lo normal.  Sangrado luego de la menopausia. Hay muchos problemas que pueden ser la causa. El tratamiento depender de la causa del sangrado. Cualquier tipo de sangrado que no sea normal debe consultarse con el mdico.  CUIDADOS EN EL HOGAR Controle su afeccin para ver si hay cambios. Estas indicaciones podrn disminuir cualquier molestia que tenga:  No use tampones ni duchas vaginales o como le haya indicado el mdico.  Cambie los apsitos con frecuencia. Deber hacerse exmenes plvicos regulares y pruebas de [place]. Realice los estudios indicados segn le indique su mdico. SOLICITE AYUDA SI:  El sangrado dura ms de 1 semana.  Se siente mareada por momentos. SOLICITE AYUDA DE INMEDIATO SI:   Se desmaya.  Tiene que [employer] apsitos cada 15 a 30 minutos.  Siente dolor en el abdomen.  Tiene fiebre.  Se siente dbil o presenta sudoracin.  Elimina cogulos grandes por la vagina.  Siente Programme researcher, broadcasting/film/video (nuseas) y devuelve  (vomita). ASEGRESE DE QUE:  Comprende estas instrucciones.  Controlar su afeccin.  Recibir ayuda de inmediato si no mejora o si empeora.   Esta informacin no tiene Theme park manager el consejo del mdico. Asegrese de hacerle al mdico cualquier pregunta que tenga.   Document Released: 04/28/2010 Document Revised: 03/31/2013 Elsevier Interactive Patient Education [employer]

## 2015-11-08 ENCOUNTER — Telehealth: Payer: Self-pay | Admitting: General Practice

## 2015-11-08 NOTE — Telephone Encounter (Signed)
Telephone call to patient regarding normal endo bx results. Called patient with pacific interpreter [phone] and informed patient. Patient verbalized understanding & asked why she was bleeding then. Told patient we do not always know the cause and it looks like they talked about doing an IUD to help with her bleeding. Patient verbalized understanding and states yes but wants to know what other options there are instead of that. Told patient if she has questions about options she should return for an appt. Patient verbalized understanding & states she will call for an appt. Patient had no other questions

## 2015-11-13 NOTE — Progress Notes (Signed)
Procedure Date: 11/01/15  Primary Surgeon: Cleda ClarksElizabeth W. Keeva Reisen, DO  Consent: Signed  Procedure:  ENDOMETRIAL BIOPSY      The indications for endometrial biopsy were reviewed.   Risks of the biopsy including cramping, bleeding, infection, uterine perforation, inadequate specimen and need for additional procedures  were discussed. The patient states she understands and agrees to undergo procedure today. Consent was signed. Time out was performed. Urine HCG was negative. A sterile speculum was placed in the patient's vagina and the cervix was prepped with Betadine. A single-toothed tenaculum was placed on the anterior lip of the cervix to stabilize it. The 3 mm pipelle was introduced into the endometrial cavity without difficulty to a depth of 7 cm, and a moderate amount of tissue was obtained and sent to pathology. The instruments were removed from the patient's vagina. Minimal bleeding from the cervix was noted. The patient tolerated the procedure well. Routine post-procedure instructions were given to the patient. The patient will be called with the results and given recommendations for further management.    Cleda ClarksElizabeth W Ravinder Hofland, DO OB/GYN Fellow Center [facility], [facility]

## 2015-11-16 ENCOUNTER — Ambulatory Visit: Payer: No Typology Code available for payment source | Admitting: Family Medicine

## 2015-11-22 ENCOUNTER — Encounter: Payer: Self-pay | Admitting: General Practice

## 2015-12-15 ENCOUNTER — Ambulatory Visit: Payer: No Typology Code available for payment source | Admitting: Family

## 2019-04-10 NOTE — L&D Delivery Note (Signed)
OB/GYN Faculty Practice Delivery Note  Debbie Strickland Debbie Strickland is a 39 y.o. [id number] s/p SVD at [redacted]w[redacted]d. She was admitted for IOL for gHTN and A1GDM.   ROM: 6h 5m with clear fluid GBS Status:  Negative/-- (08/27 1019) Maximum Maternal Temperature: n/a   Labor Progress:  Initial SVE: 1/thick/high. She received multiple doses of cytotec and a foley balloon. Patient diagnosed with PEC w SF while admitted due to multiple severe range blood pressures requiring antihypertensive therapy. Patient made rapid change from 4cm after FB fell out. She then progressed to complete.   Delivery Date/Time: 1452 on 9/15 Delivery: Called to room and patient was complete and pushing. At time of delivery had prolonged decel to 60's. Head delivered ROA with compound arm. No nuchal cord present. Shoulder and body delivered in usual fashion. Infant with spontaneous cry, placed on mother's abdomen, dried and stimulated. Cord clamped x 2 and cut without delay due to fetal status. Cord blood drawn. Placenta delivered spontaneously with gentle cord traction. Fundus firm with massage and Pitocin. Labia, perineum, vagina, and cervix inspected inspected with small vaginal wall laceration, repaired and hemostatic right periurethral laceration, not repaired.  Baby Weight: pending  Placenta: Sent to L&D Complications: None Lacerations: small vaginal wall, repaired and right periurethral  EBL: 200 mL Analgesia: Epidural   Infant:  APGAR (1 MIN): 6   APGAR (5 MINS):   APGAR (10 MINS):     Casper Harrison, MD [facility] Family Medicine Fellow, [facility] for [facility], [facility] 12/23/2019, 3:39 PM

## 2019-05-14 ENCOUNTER — Ambulatory Visit (INDEPENDENT_AMBULATORY_CARE_PROVIDER_SITE_OTHER): Payer: Self-pay

## 2019-05-14 ENCOUNTER — Other Ambulatory Visit: Payer: Self-pay

## 2019-05-14 DIAGNOSIS — Z3201 Encounter for pregnancy test, result positive: Secondary | ICD-10-CM

## 2019-05-14 DIAGNOSIS — Z3687 Encounter for antenatal screening for uncertain dates: Secondary | ICD-10-CM

## 2019-05-14 LAB — POCT PREGNANCY, URINE: Preg Test, Ur: POSITIVE — AB

## 2019-05-14 NOTE — Progress Notes (Signed)
Pt here today for pregnancy test resulted positive.   With Spanish Interpreter Raquel M., pt reports that she she is unsure of her LMP.  Pt reports that she has not had a period since November around Thanksgiving and that she has irregular periods.  OB US scheduled for 05/21/19 @ 1245 for dating.  Pt denies any vaginal bleeding with mild menstrual  cramping pain that is across the lower abdomen.  Pt advised that if her pain intensifies to please go to MAU.  I also informed pt that after her [place] appt she will come back to the office to get results from a nurse.  Medications/allergies reviewed.  List of medications safe to take in pregnancy given to pt.  Pt verbalized understanding with no further questions.   Addison Naegeli, RN 05/14/19

## 2019-05-21 ENCOUNTER — Ambulatory Visit (HOSPITAL_COMMUNITY)
Admission: RE | Admit: 2019-05-21 | Discharge: 2019-05-21 | Disposition: A | Discharge status: Home / Self Care | Payer: Self-pay | Source: Ambulatory Visit | Attending: Obstetrics and Gynecology | Admitting: Obstetrics and Gynecology

## 2019-05-21 ENCOUNTER — Other Ambulatory Visit: Payer: Self-pay

## 2019-05-21 ENCOUNTER — Other Ambulatory Visit: Payer: Self-pay | Admitting: Obstetrics and Gynecology

## 2019-05-21 ENCOUNTER — Encounter: Payer: Self-pay | Admitting: Family Medicine

## 2019-05-21 ENCOUNTER — Ambulatory Visit (INDEPENDENT_AMBULATORY_CARE_PROVIDER_SITE_OTHER): Payer: No Typology Code available for payment source

## 2019-05-21 DIAGNOSIS — Z712 Person consulting for explanation of examination or test findings: Secondary | ICD-10-CM

## 2019-05-21 DIAGNOSIS — Z3687 Encounter for antenatal screening for uncertain dates: Secondary | ICD-10-CM

## 2019-05-21 NOTE — Progress Notes (Signed)
Pt here today for OB US results for uncertain of LMP.  Notified Dr. Macon Large who recommended that pt start [facility] and to start taking PNV.  With [employer] L., pt informed that she has a viable pregnancy, EDD 12/28/19, 8w 3d today with FHR 176 bpm.  Medications/allergies reviewed.  Proof of pregnancy letter provided by the front office to start OB care.  Pt verbalized understanding.   Addison Naegeli, RN 05/21/19

## 2019-05-21 NOTE — Progress Notes (Signed)
Patient seen and assessed by nursing staff during this encounter. I have reviewed the chart and agree with the documentation and plan.  Brayleigh Rybacki, MD 05/21/2019 4:07 PM    

## 2019-06-18 ENCOUNTER — Other Ambulatory Visit: Payer: Self-pay

## 2019-06-18 ENCOUNTER — Ambulatory Visit (INDEPENDENT_AMBULATORY_CARE_PROVIDER_SITE_OTHER): Payer: No Typology Code available for payment source | Admitting: *Deleted

## 2019-06-18 DIAGNOSIS — O99210 Obesity complicating pregnancy, unspecified trimester: Secondary | ICD-10-CM

## 2019-06-18 DIAGNOSIS — O09529 Supervision of elderly multigravida, unspecified trimester: Secondary | ICD-10-CM | POA: Insufficient documentation

## 2019-06-18 DIAGNOSIS — O0990 Supervision of high risk pregnancy, unspecified, unspecified trimester: Secondary | ICD-10-CM | POA: Insufficient documentation

## 2019-06-18 NOTE — Progress Notes (Signed)
9:30 patient didn't realize was phone visit and came to office; so changed to in person for new ob visit.  Debbie Landgren,RN  I explained I am completing her New OB Intake today. We discussed Her EDD and that it is based on  Early [place] due to unsure LMP . I reviewed her allergies, meds, OB History, Medical /Surgical history, and appropriate screenings. I informed her of [facility] services.  I explained we will give her  a blood pressure cuff at her first ob visit appointment and we will  show her how to use it. Explained  then we will have her take her blood pressure weekly. I explained she will have some visits in office and some virtually. We assisted her with downloading MyChart app. I reviewed her new ob  appointment date/ time with her , our location and to wear mask, no visitors.  I explained she will have a pelvic exam, ob bloodwork, hemoglobin a1C, cbg ,pap, and  genetic testing if desired,- she is undecided about a panorama. I scheduled an [place] at 19 weeks and gave her the appointment.I offered her genetic counseling which she declines for now.  She voices understanding.  Debbie Gienger,RN

## 2019-06-18 NOTE — Patient Instructions (Signed)

## 2019-06-25 ENCOUNTER — Ambulatory Visit (INDEPENDENT_AMBULATORY_CARE_PROVIDER_SITE_OTHER): Payer: Self-pay | Admitting: Obstetrics and Gynecology

## 2019-06-25 ENCOUNTER — Other Ambulatory Visit: Payer: Self-pay

## 2019-06-25 VITALS — BP 136/79 | HR 88 | Wt 200.5 lb

## 2019-06-25 DIAGNOSIS — Z3A13 13 weeks gestation of pregnancy: Secondary | ICD-10-CM

## 2019-06-25 DIAGNOSIS — O0990 Supervision of high risk pregnancy, unspecified, unspecified trimester: Secondary | ICD-10-CM

## 2019-06-25 DIAGNOSIS — Z1151 Encounter for screening for human papillomavirus (HPV): Secondary | ICD-10-CM

## 2019-06-25 DIAGNOSIS — Z113 Encounter for screening for infections with a predominantly sexual mode of transmission: Secondary | ICD-10-CM

## 2019-06-25 DIAGNOSIS — O0991 Supervision of high risk pregnancy, unspecified, first trimester: Secondary | ICD-10-CM

## 2019-06-25 DIAGNOSIS — O09521 Supervision of elderly multigravida, first trimester: Secondary | ICD-10-CM

## 2019-06-25 DIAGNOSIS — Z124 Encounter for screening for malignant neoplasm of cervix: Secondary | ICD-10-CM

## 2019-06-25 LAB — POCT URINALYSIS DIP (DEVICE)
Bilirubin Urine: NEGATIVE
Glucose, UA: NEGATIVE mg/dL
Ketones, ur: NEGATIVE mg/dL
Leukocytes,Ua: NEGATIVE
Nitrite: NEGATIVE
Protein, ur: NEGATIVE mg/dL
Specific Gravity, Urine: 1.025 (ref 1.005–1.030)
Urobilinogen, UA: 0.2 mg/dL (ref 0.0–1.0)
pH: 6.5 (ref 5.0–8.0)

## 2019-06-25 NOTE — Patient Instructions (Signed)
Primer trimestre de Psychiatrist First Trimester of Pregnancy  El primer trimestre de Psychiatrist se extiende desde la semana1 hasta el final de la semana13 (mes1 al mes3). Durante este tiempo, el beb comenzar a desarrollarse dentro suyo. Entre la semana6 y [place], se forman los ojos y Recruitment consultant, y los latidos del corazn pueden escucharse en la ecografa. Al final de las 12semanas, todos los rganos del beb estn formados. El cuidado prenatal es toda la asistencia mdica que usted recibe antes del nacimiento del beb. Asegrese de recibir un buen cuidado prenatal y de seguir todas las indicaciones del mdico. Siga estas indicaciones en su casa: Medicamentos  Tome los medicamentos de venta libre y los recetados solamente como se lo haya indicado el mdico. Algunos medicamentos son seguros para tomar durante el Psychiatrist y otros no lo son.  Tome vitaminas prenatales que contengan por lo menos (?g) de cido flico.  Si tiene problemas para defecar (estreimiento), tome un medicamento que ablanda la materia fecal (laxante), siempre que lo autorice el mdico. Comida y bebida   Ingiera alimentos saludables de [place].  El Firefighter la cantidad de peso que [place].  No coma carne cruda ni quesos sin cocinar.  Si tiene Programme researcher, broadcasting/film/video (nuseas) o vomita (vmitos): ? Ingiera 4 o 5comidas pequeas por Geophysical data processor de 3abundantes. ? Intente comer algunas galletitas saladas. ? Beba lquidos Alt[employer] Group, en lugar de [employer].  Para evitar el estreimiento: ? Consuma alimentos ricos en fibra, como frutas y verduras frescas, cereales integrales y legumbres. ? Beba suficiente lquido para mantener el pis (orina) claro o de color amarillo plido. Actividad  Haga ejercicios solamente como se lo haya indicado el mdico. Deje de hacer ejercicios si tiene clicos o dolor en la parte baja del vientre (abdomen) o en la cintura.  No haga  actividad fsica si el clima est demasiado caluroso o hmedo, o si se encuentra en un lugar muy alto (altitud elevada).  Intente no estar de pie FedEx. Mueva las piernas con frecuencia si debe estar de pie en un lugar durante mucho tiempo.  Evite levantar pesos [employer].  Use zapatos con tacones bajos. Mantenga una buena postura al sentarse y pararse.  Puede tener [employer] [place] [employer]ers, a menos que el mdico le indique lo contrario. Alivio del dolor y del Dentist  Use un sostn que le brinde buen soporte si le duelen las [place].  Dese baos de asiento con agua tibia para Engineer, materials o las molestias causadas por las hemorroides. Use una crema antihemorroidal si el mdico se lo permite.  Descanse con las piernas elevadas si tiene calambres o dolor de cintura.  Si tiene las venas de las piernas hinchadas y abultadas (venas varicosas): ? Use medias elsticas de soporte o medias de compresin como se lo haya indicado el mdico. ? Levante (eleve) los pies durante , 3 o 4veces por Futures trader. ? Limite la sal en sus alimentos. Cuidado prenatal  Programe las visitas prenatales para la semana12 de [place].  Escriba sus preguntas. Llvelas cuando concurra a las visitas prenatales.  Concurra a todas las visitas prenatales como se lo haya indicado el mdico. Esto es importante. Seguridad  Use el cinturn de seguridad en todo momento mientras conduce.  Haga una lista con los nmeros de telfono en caso de Associate Professor. Esta lista debe incluir los nmeros de los familiares, los amigos, el hospital y los departamentos de polica y de bomberos. Instrucciones generales  Pdale  al mdico que la derive a clases prenatales en su localidad. Debe comenzar a tomar las clases antes de Cytogeneticist en el mes6 de embarazo.  Pida ayuda si necesita asesoramiento o asistencia con la alimentacin. El mdico puede aconsejarla o indicarle dnde recurrir para recibir [place].  No se d baos de  inmersin en agua caliente, baos turcos ni saunas.  No se haga duchas vaginales ni use tampones o toallas higinicas perfumadas.  No mantenga las piernas cruzadas durante [place].  Evite las hierbas y el alcohol. Evite los frmacos que el mdico no haya autorizado.  No consuma ningn producto que contenga tabaco, lo que incluye cigarrillos, tabaco de Theatre manager o Administrator, Civil Service. Si necesita ayuda para dejar de fumar, consulte al American Express. Puede recibir asesoramiento u otro tipo de apoyo para dejar de fumar.  Evite el contacto con las bandejas sanitarias de los gatos y la tierra que estos animales usan. Estos elementos contienen grmenes que pueden causar defectos congnitos al beb y la posible prdida del feto (aborto espontneo) o muerte fetal.  Visite al dentista. En su casa, lvese los dientes con un cepillo dental suave. Psese el hilo dental con suavidad. Comunquese con un mdico si:  Tiene mareos.  Tiene clicos leves o siente presin en la parte baja del vientre.  Sufre un dolor persistente en el abdomen.  Sigue teniendo AT&T, vomita o la materia fecal es lquida (diarrea).  Nota una secrecin de lquido con olor ftido que proviene de la vagina.  Tiene dolor al hacer pis (orinar).  Tiene el rostro, las [place], las piernas o los tobillos ms hinchados (inflamados). Solicite ayuda de inmediato si:  Tiene fiebre.  Tiene una prdida de lquido por la vagina.  Tiene sangrado o pequeas prdidas vaginales.  Tiene clicos o dolor muy intensos en el vientre.  Sube o baja de peso rpidamente.  Vomita sangre. Esto tiene Art gallery manager similar a la borra del caf.  Est en contacto con personas que tienen rubola, la quinta enfermedad o varicela.  Siente un dolor de cabeza muy intenso.  Le falta el aire.  Sufre cualquier tipo de traumatismo, por ejemplo, debido a una cada o un accidente automovilstico. Resumen  El primer trimestre de Psychiatrist se  extiende desde la semana1 hasta el final de la semana13 (mes1 al mes3).  Para cuidar su salud y la del beb en gestacin, necesitar consumir alimentos saludables, tomar medicamentos solamente si lo autoriza el mdico, y Radio producer actividades que sean seguras para usted y para su beb.  Concurra a todas las visitas de control como se lo haya indicado el mdico. Esto es importante porque el mdico deber asegurar que el beb est saludable y est creciendo bien. Esta informacin no tiene Theme park manager el consejo del mdico. Asegrese de hacerle al mdico cualquier pregunta que tenga. Document Revised: 10/30/2016 Document Reviewed: 10/30/2016 Elsevier Patient Education  2020 [employer].

## 2019-06-25 NOTE — Progress Notes (Signed)
hepSubjective:  Debbie Strickland is a 39 y.o. G3P2002 at 51w3dbeing seen today for her first OB appt. EDD by first trimester U/S. Denies any chronic medical problems or medications. H/O TSVD x 2 without problems.   She is currently monitored for the following issues for this high-risk pregnancy and has Supervision of high risk pregnancy, antepartum; AMA (advanced maternal age) multigravida 35+; and Obesity in pregnancy on their problem list.  Patient reports no complaints.  Contractions: Not present. Vag. Bleeding: None.  Movement: Absent. Denies leaking of fluid.   The following portions of the patient's history were reviewed and updated as appropriate: allergies, current medications, past family history, past medical history, past social history, past surgical history and problem list. Problem list updated.  Objective:   Vitals:   06/25/19 1400  BP: 136/79  Pulse: 88  Weight: 200 lb 8 oz (90.9 kg)    Fetal Status:     Movement: Absent     General:  Alert, oriented and cooperative. Patient is in no acute distress.  Skin: Skin is warm and dry. No rash noted.   Cardiovascular: Normal heart rate noted  Respiratory: Normal respiratory effort, no problems with respiration noted  Abdomen: Soft, gravid, appropriate for gestational age. Pain/Pressure: Present     Pelvic:  Cervical exam performed        Extremities: Normal range of motion.  Edema: None  Mental Status: Normal mood and affect. Normal behavior. Normal judgment and thought content.   Urinalysis:      Assessment and Plan:  Pregnancy: G3P2002 at 176w3d1. Supervision of high risk pregnancy, antepartum Prenatal labs and care reviewed with pt. Genetic testing discussed. BP cuff provided. BP monitoring reviewed with pt - Culture, OB Urine - Flu Vaccine QUAD 36+ mos IM - Genetic Screening - Obstetric Panel, Including HIV - Hemoglobin A1c - Cytology - PAP( [facility]) - TSH - Comp Met (CMET) - Protein / creatinine  ratio, urine  2. Multigravida of advanced maternal age in first trimester See above - Culture, OB Urine - Flu Vaccine QUAD 36+ mos IM - Genetic Screening - Obstetric Panel, Including HIV - Hemoglobin A1c - Cytology - PAP( [facility]) - TSH - Comp Met (CMET) - Protein / creatinine ratio, urine  Live interpreter used during today's visit  Preterm labor symptoms and general obstetric precautions including but not limited to vaginal bleeding, contractions, leaking of fluid and fetal movement were reviewed in detail with the patient. Please refer to After Visit Summary for other counseling recommendations.  Return in about 4 weeks (around 07/23/2019) for OB visit, face to face, any provider.   ErChancy MilroyMD

## 2019-06-26 LAB — OBSTETRIC PANEL, INCLUDING HIV
Antibody Screen: NEGATIVE
Basophils Absolute: 0 10*3/uL (ref 0.0–0.2)
Basos: 0 %
EOS (ABSOLUTE): 0.1 10*3/uL (ref 0.0–0.4)
Eos: 1 %
HIV Screen 4th Generation wRfx: NONREACTIVE
Hematocrit: 37.9 % (ref 34.0–46.6)
Hemoglobin: 12.4 g/dL (ref 11.1–15.9)
Hepatitis B Surface Ag: NEGATIVE
Immature Grans (Abs): 0 10*3/uL (ref 0.0–0.1)
Immature Granulocytes: 0 %
Lymphocytes Absolute: 2 10*3/uL (ref 0.7–3.1)
Lymphs: 18 %
MCH: 27.4 pg (ref 26.6–33.0)
MCHC: 32.7 g/dL (ref 31.5–35.7)
MCV: 84 fL (ref 79–97)
Monocytes Absolute: 0.4 10*3/uL (ref 0.1–0.9)
Monocytes: 4 %
Neutrophils Absolute: 8.4 10*3/uL — ABNORMAL HIGH (ref 1.4–7.0)
Neutrophils: 77 %
Platelets: 256 10*3/uL (ref 150–450)
RBC: 4.52 x10E6/uL (ref 3.77–5.28)
RDW: 14.3 % (ref 11.7–15.4)
RPR Ser Ql: NONREACTIVE
Rh Factor: POSITIVE
Rubella Antibodies, IGG: 4.92 index (ref >0.99)
WBC: 11 10*3/uL — ABNORMAL HIGH (ref 3.4–10.8)

## 2019-06-26 LAB — HEPATITIS C ANTIBODY: Hep C Virus Ab: <0.1 s/co ratio (ref 0.0–0.9)

## 2019-06-26 LAB — COMPREHENSIVE METABOLIC PANEL
ALT: 9 IU/L (ref 0–32)
AST: 15 IU/L (ref 0–40)
Albumin/Globulin Ratio: 1.7 (ref 1.2–2.2)
Albumin: 4.3 g/dL (ref 3.8–4.8)
Alkaline Phosphatase: 43 IU/L (ref 39–117)
BUN/Creatinine Ratio: 9 (ref 9–23)
BUN: 4 mg/dL — ABNORMAL LOW (ref 6–20)
Bilirubin Total: 0.3 mg/dL (ref 0.0–1.2)
CO2: 19 mmol/L — ABNORMAL LOW (ref 20–29)
Calcium: 9.6 mg/dL (ref 8.7–10.2)
Chloride: 103 mmol/L (ref 96–106)
Creatinine, Ser: 0.45 mg/dL — ABNORMAL LOW (ref 0.57–1.00)
GFR calc Af Amer: 146 mL/min/{1.73_m2} (ref >59)
GFR calc non Af Amer: 127 mL/min/{1.73_m2} (ref >59)
Globulin, Total: 2.6 g/dL (ref 1.5–4.5)
Glucose: 79 mg/dL (ref 65–99)
Potassium: 4.1 mmol/L (ref 3.5–5.2)
Sodium: 135 mmol/L (ref 134–144)
Total Protein: 6.9 g/dL (ref 6.0–8.5)

## 2019-06-26 LAB — HEMOGLOBIN A1C
Est. average glucose Bld gHb Est-mCnc: 114 mg/dL
Hgb A1c MFr Bld: 5.6 % (ref 4.8–5.6)

## 2019-06-26 LAB — PROTEIN / CREATININE RATIO, URINE
Creatinine, Urine: 87 mg/dL
Protein, Ur: 8.1 mg/dL
Protein/Creat Ratio: 93 mg/g creat (ref 0–200)

## 2019-06-26 LAB — TSH: TSH: 2.93 u[IU]/mL (ref 0.450–4.500)

## 2019-06-27 LAB — URINE CULTURE, OB REFLEX

## 2019-06-27 LAB — CULTURE, OB URINE

## 2019-06-30 ENCOUNTER — Encounter: Payer: Self-pay | Admitting: Obstetrics and Gynecology

## 2019-06-30 ENCOUNTER — Encounter: Payer: Self-pay | Admitting: General Practice

## 2019-06-30 DIAGNOSIS — R87613 High grade squamous intraepithelial lesion on cytologic smear of cervix (HGSIL): Secondary | ICD-10-CM | POA: Insufficient documentation

## 2019-06-30 DIAGNOSIS — N871 Moderate cervical dysplasia: Secondary | ICD-10-CM | POA: Insufficient documentation

## 2019-06-30 LAB — CYTOLOGY - PAP
Chlamydia: NEGATIVE
Comment: NEGATIVE
Comment: NEGATIVE
Comment: NORMAL
Diagnosis: HIGH — AB
High risk HPV: NEGATIVE
Neisseria Gonorrhea: NEGATIVE

## 2019-07-14 ENCOUNTER — Encounter: Payer: Self-pay | Admitting: *Deleted

## 2019-07-23 ENCOUNTER — Encounter: Payer: No Typology Code available for payment source | Admitting: Obstetrics & Gynecology

## 2019-07-30 ENCOUNTER — Ambulatory Visit (INDEPENDENT_AMBULATORY_CARE_PROVIDER_SITE_OTHER): Payer: No Typology Code available for payment source | Admitting: Obstetrics & Gynecology

## 2019-07-30 ENCOUNTER — Other Ambulatory Visit (HOSPITAL_COMMUNITY)
Admission: RE | Admit: 2019-07-30 | Discharge: 2019-07-30 | Disposition: A | Discharge status: Home / Self Care | Payer: No Typology Code available for payment source | Source: Ambulatory Visit | Attending: Obstetrics & Gynecology | Admitting: Obstetrics & Gynecology

## 2019-07-30 ENCOUNTER — Other Ambulatory Visit: Payer: Self-pay

## 2019-07-30 VITALS — BP 118/77 | HR 101 | Wt 200.3 lb

## 2019-07-30 DIAGNOSIS — O0993 Supervision of high risk pregnancy, unspecified, third trimester: Secondary | ICD-10-CM

## 2019-07-30 DIAGNOSIS — R87613 High grade squamous intraepithelial lesion on cytologic smear of cervix (HGSIL): Secondary | ICD-10-CM

## 2019-07-30 DIAGNOSIS — Z3A18 18 weeks gestation of pregnancy: Secondary | ICD-10-CM

## 2019-07-30 DIAGNOSIS — O0990 Supervision of high risk pregnancy, unspecified, unspecified trimester: Secondary | ICD-10-CM

## 2019-07-30 NOTE — Patient Instructions (Signed)
Colposcopa, cuidados posteriores Colposcopy, Care After Lea esta informacin sobre cmo cuidarse despus del procedimiento. Su mdico tambin podr darle indicaciones ms especficas. Comunquese con su mdico si tiene problemas o preguntas. Qu puedo esperar despus del procedimiento? Si se le realiz una colposcopa sin biopsia, puede esperar sentirse bien de inmediato, pero es posible que presente manchas de sangre por algunos das. Puede reanudar sus actividades habituales. Si se le realiz una colposcopa con biopsia, es frecuente que presente lo siguiente:  Sensibilidad y dolor. Esto puede durar algunos das.  Sensacin de desvanecimiento.  Sangrado leve de la vagina o secrecin granulada de color oscuro. Esto puede durar algunos das. La secrecin puede deberse a una solucin que se us durante el procedimiento. Durante este tiempo deber usar un apsito sanitario.  Manchas durante al menos 48horas despus del procedimiento. Siga estas indicaciones en su casa:   Tome los medicamentos de venta libre y los recetados solamente como se lo haya indicado el mdico. Hable con el medicamento acerca de qu tipo de analgsico de venta libre y recetado puede volver a tomar. Es especialmente importante que hable con el mdico si toma medicamentos anticoagulantes.  No conduzca ni use maquinaria pesada mientras toma analgsicos recetados.  Durante al menos 3 das despus del procedimiento o durante el tiempo que le haya indicado el mdico, evite lo siguiente: ? Las duchas vaginales. ? Los tampones. ? Tener relaciones sexuales.  Contine usando un mtodo anticonceptivo (anticoncepcin).  Limite la actividad fsica durante el primer da despus del procedimiento como se lo haya indicado el mdico. Pregntele al mdico qu actividades son seguras para usted.  Es su responsabilidad retirar los resultados del procedimiento. Consulte a su mdico o en el departamento donde se realice el  procedimiento cundo estarn listos los resultados.  Concurra a todas las visitas de control como se lo haya indicado el mdico. Esto es importante. Comunquese con un mdico si:  Tiene una erupcin cutnea. Solicite ayuda de inmediato si:  Tiene una hemorragia vaginal abundante o elimina cogulos de sangre. Esto incluye usar ms de un apsito sanitario por hora durante 2 horas seguidas.  Tiene fiebre o siente escalofros.  Siente dolor plvico.  Tiene secrecin vaginal anormal, color amarillenta o con mal olor. Puede ser un signo de infeccin.  Tiene dolor intenso o clicos en la parte baja del abdomen que no se alivian con medicamentos.  Tiene vahdos, se siente mareada o se desmaya. Resumen  Si se le realiz una colposcopa sin biopsia, puede esperar sentirse bien de inmediato, pero es posible que presente manchas de sangre por algunos das. Puede reanudar sus actividades habituales.  Si se le realiz una colposcopa con biopsia, puede notar un dolor leve y manchas de sangre durante 48 horas despus del procedimiento.  Evite usar duchas vaginales, usar tampones y mantener relaciones sexuales durante 3 das luego del procedimiento o durante el tiempo que le haya indicado el mdico.  Comunquese con el mdico si tiene hemorragia, dolor intenso o signos de infeccin. Esta informacin no tiene como fin reemplazar el consejo del mdico. Asegrese de hacerle al mdico cualquier pregunta que tenga. Document Revised: 02/13/2017 Document Reviewed: 10/23/2012 Elsevier Patient Education  2020 Elsevier Inc.  

## 2019-07-30 NOTE — Progress Notes (Signed)
   PRENATAL VISIT NOTE  Subjective:  Debbie Strickland is a 39 y.o. G3P2002 at [redacted]w[redacted]d being seen today for ongoing prenatal care.  Debbie Strickland is currently monitored for the following issues for this high-risk pregnancy and has Supervision of high risk pregnancy, antepartum; AMA (advanced maternal age) multigravida 35+; Obesity in pregnancy; and HGSIL (high grade squamous intraepithelial lesion) on Pap smear of cervix on their problem list.  Patient reports no complaints.  Contractions: Not present. Vag. Bleeding: None.  Movement: Present. Denies leaking of fluid.   The following portions of the patient's history were reviewed and updated as appropriate: allergies, current medications, past family history, past medical history, past social history, past surgical history and problem list.   Objective:   Vitals:   07/30/19 1612  BP: 118/77  Pulse: (!) 101  Weight: 200 lb 4.8 oz (90.9 kg)    Fetal Status: Fetal Heart Rate (bpm): 154   Movement: Present     General:  Alert, oriented and cooperative. Patient is in no acute distress.  Skin: Skin is warm and dry. No rash noted.   Cardiovascular: Normal heart rate noted  Respiratory: Normal respiratory effort, no problems with respiration noted  Abdomen: Soft, gravid, appropriate for gestational age.  Pain/Pressure: Absent     Pelvic: Cervical exam deferred        Extremities: Normal range of motion.  Edema: None  Mental Status: Normal mood and affect. Normal behavior. Normal judgment and thought content.   Assessment and Plan:  Pregnancy: G3P2002 at [redacted]w[redacted]d 1. Supervision of high risk pregnancy, antepartum   2. HGSIL (high grade squamous intraepithelial lesion) on Pap smear of cervix Colposcopy below - Surgical pathology( [place]/ [facility])  Preterm labor symptoms and general obstetric precautions including but not limited to vaginal bleeding, contractions, leaking of fluid and fetal movement were reviewed in detail with the  patient. Please refer to After Visit Summary for other counseling recommendations.   No follow-ups on file.  Future Appointments  Date Time Provider Department Center  08/04/2019  9:00 AM [facility]-MFC [place] 3 [facility]-MFCUS MFC-US  08/12/2019  2:35 PM [place] Bing, MD [facility]    Scheryl Darter, MD   Patient given informed consent yes Punctation? nono Mosaicism?  no Abnormal vasculature?  no Biopsies? Yes 9 o'clock ECC? no  COMMENTS:  Patient was given post procedure instructions.  Debbie Strickland will return in 2 weeks for results.  Scheryl Darter, MD

## 2019-08-03 ENCOUNTER — Encounter: Payer: Self-pay | Admitting: *Deleted

## 2019-08-03 LAB — SURGICAL PATHOLOGY

## 2019-08-04 ENCOUNTER — Ambulatory Visit (HOSPITAL_COMMUNITY)
Admission: RE | Admit: 2019-08-04 | Discharge: 2019-08-04 | Disposition: A | Discharge status: Home / Self Care | Payer: No Typology Code available for payment source | Source: Ambulatory Visit | Attending: Obstetrics and Gynecology | Admitting: Obstetrics and Gynecology

## 2019-08-04 ENCOUNTER — Other Ambulatory Visit (HOSPITAL_COMMUNITY): Payer: Self-pay | Admitting: *Deleted

## 2019-08-04 ENCOUNTER — Other Ambulatory Visit: Payer: Self-pay

## 2019-08-04 ENCOUNTER — Ambulatory Visit (HOSPITAL_COMMUNITY): Payer: No Typology Code available for payment source | Admitting: *Deleted

## 2019-08-04 ENCOUNTER — Encounter (HOSPITAL_COMMUNITY): Payer: Self-pay

## 2019-08-04 DIAGNOSIS — O09529 Supervision of elderly multigravida, unspecified trimester: Secondary | ICD-10-CM

## 2019-08-04 DIAGNOSIS — O0990 Supervision of high risk pregnancy, unspecified, unspecified trimester: Secondary | ICD-10-CM

## 2019-08-04 DIAGNOSIS — O99212 Obesity complicating pregnancy, second trimester: Secondary | ICD-10-CM

## 2019-08-04 DIAGNOSIS — Z3A19 19 weeks gestation of pregnancy: Secondary | ICD-10-CM

## 2019-08-04 DIAGNOSIS — O99210 Obesity complicating pregnancy, unspecified trimester: Secondary | ICD-10-CM

## 2019-08-04 DIAGNOSIS — O09522 Supervision of elderly multigravida, second trimester: Secondary | ICD-10-CM

## 2019-08-04 DIAGNOSIS — E669 Obesity, unspecified: Secondary | ICD-10-CM

## 2019-08-04 NOTE — Progress Notes (Signed)
HGSIL, wil need colposcopy

## 2019-08-04 NOTE — Progress Notes (Signed)
Per Dr. Debroah Loop pt had colpo on 07/30/19 pt just needs to be informed that because she is pregnant, had colposcopy, no LEEP for now, will repeat colpo postpartum.   Addison Naegeli, RN

## 2019-08-12 ENCOUNTER — Ambulatory Visit (INDEPENDENT_AMBULATORY_CARE_PROVIDER_SITE_OTHER): Payer: No Typology Code available for payment source | Admitting: Obstetrics and Gynecology

## 2019-08-12 ENCOUNTER — Other Ambulatory Visit: Payer: Self-pay

## 2019-08-12 VITALS — BP 122/68 | HR 90 | Wt 201.3 lb

## 2019-08-12 DIAGNOSIS — O99210 Obesity complicating pregnancy, unspecified trimester: Secondary | ICD-10-CM

## 2019-08-12 DIAGNOSIS — Z3A20 20 weeks gestation of pregnancy: Secondary | ICD-10-CM

## 2019-08-12 DIAGNOSIS — O0990 Supervision of high risk pregnancy, unspecified, unspecified trimester: Secondary | ICD-10-CM

## 2019-08-12 DIAGNOSIS — E669 Obesity, unspecified: Secondary | ICD-10-CM

## 2019-08-12 DIAGNOSIS — Z6841 Body Mass Index (BMI) 40.0 and over, adult: Secondary | ICD-10-CM

## 2019-08-12 DIAGNOSIS — R87613 High grade squamous intraepithelial lesion on cytologic smear of cervix (HGSIL): Secondary | ICD-10-CM

## 2019-08-12 DIAGNOSIS — O09522 Supervision of elderly multigravida, second trimester: Secondary | ICD-10-CM

## 2019-08-12 DIAGNOSIS — Z789 Other specified health status: Secondary | ICD-10-CM

## 2019-08-12 DIAGNOSIS — Z603 Acculturation difficulty: Secondary | ICD-10-CM | POA: Insufficient documentation

## 2019-08-12 HISTORY — DX: Body Mass Index (BMI) 40.0 and over, adult: Z684

## 2019-08-12 MED ORDER — VITAMIN D3 50 MCG (2000 UT) PO CAPS: 2000.0000 [IU] | ORAL_CAPSULE | Freq: Every day | ORAL | 5 refills | Status: AC

## 2019-08-12 NOTE — Progress Notes (Signed)
Prenatal Visit Note Date: 08/12/2019 Clinic: [facility]  Subjective:  Debbie Strickland is a 39 y.o. [id number] at [redacted]w[redacted]d being seen today for ongoing prenatal care.  She is currently monitored for the following issues for this high-risk pregnancy and has Supervision of high risk pregnancy, antepartum; AMA (advanced maternal age) multigravida 35+; Obesity in pregnancy; HGSIL (high grade squamous intraepithelial lesion) on Pap smear of cervix; BMI 40.0-44.9, adult (HCC); and Language barrier on their problem list.  Patient reports no complaints.   Contractions: Not present. Vag. Bleeding: None.  Movement: Present. Denies leaking of fluid.   The following portions of the patient's history were reviewed and updated as appropriate: allergies, current medications, past family history, past medical history, past social history, past surgical history and problem list. Problem list updated.  Objective:   Vitals:   08/12/19 1456  BP: 122/68  Pulse: 90  Weight: 201 lb 4.8 oz (91.3 kg)    Fetal Status: Fetal Heart Rate (bpm): 154   Movement: Present     General:  Alert, oriented and cooperative. Patient is in no acute distress.  Skin: Skin is warm and dry. No rash noted.   Cardiovascular: Normal heart rate noted  Respiratory: Normal respiratory effort, no problems with respiration noted  Abdomen: Soft, gravid, appropriate for gestational age. Pain/Pressure: Absent     Pelvic:  Cervical exam deferred        Extremities: Normal range of motion.  Edema: None  Mental Status: Normal mood and affect. Normal behavior. Normal judgment and thought content.   Urinalysis:      Assessment and Plan:  Pregnancy: G3P2002 at [redacted]w[redacted]d  1. Supervision of high risk pregnancy, antepartum Routine care. Pt wondering about the covid vaccine. Pros/cons d/w her. I recommend, at least, starting vitamin d3 and checking her levels at her 28wk labs since patients with normal vitamin d levels  50-60 are at low risk of covid  2. Multigravida of advanced maternal age in second trimester  3. Obesity in pregnancy  4. HGSIL (high grade squamous intraepithelial lesion) on Pap smear of cervix D/w her importance of follow up and risk of cx cancer if left untreated. I told her I recommend a repeat colpo at 12wks with Dr. Debroah Loop to watch the dysplasia  5. BMI 40.0-44.9, adult (HCC)  6. Language barrier Interpreter used  Preterm labor symptoms and general obstetric precautions including but not limited to vaginal bleeding, contractions, leaking of fluid and fetal movement were reviewed in detail with the patient. Please refer to After Visit Summary for other counseling recommendations.  Return in about 27 days (around 09/08/2019) for high risk, in person.with repeat u/s already scheduled   [place] Bing, MD

## 2019-09-08 ENCOUNTER — Ambulatory Visit (HOSPITAL_COMMUNITY): Payer: No Typology Code available for payment source | Attending: Obstetrics

## 2019-09-08 ENCOUNTER — Other Ambulatory Visit: Payer: Self-pay

## 2019-09-08 ENCOUNTER — Other Ambulatory Visit: Payer: Self-pay | Admitting: *Deleted

## 2019-09-08 ENCOUNTER — Ambulatory Visit (INDEPENDENT_AMBULATORY_CARE_PROVIDER_SITE_OTHER): Payer: No Typology Code available for payment source | Admitting: Family Medicine

## 2019-09-08 ENCOUNTER — Ambulatory Visit: Payer: No Typology Code available for payment source | Admitting: *Deleted

## 2019-09-08 ENCOUNTER — Encounter: Payer: Self-pay | Admitting: *Deleted

## 2019-09-08 VITALS — BP 117/66 | HR 73 | Wt 202.9 lb

## 2019-09-08 DIAGNOSIS — O99210 Obesity complicating pregnancy, unspecified trimester: Secondary | ICD-10-CM

## 2019-09-08 DIAGNOSIS — Z3A24 24 weeks gestation of pregnancy: Secondary | ICD-10-CM

## 2019-09-08 DIAGNOSIS — O09522 Supervision of elderly multigravida, second trimester: Secondary | ICD-10-CM | POA: Insufficient documentation

## 2019-09-08 DIAGNOSIS — O0990 Supervision of high risk pregnancy, unspecified, unspecified trimester: Secondary | ICD-10-CM | POA: Insufficient documentation

## 2019-09-08 DIAGNOSIS — O99212 Obesity complicating pregnancy, second trimester: Secondary | ICD-10-CM

## 2019-09-08 DIAGNOSIS — Z603 Acculturation difficulty: Secondary | ICD-10-CM

## 2019-09-08 DIAGNOSIS — E669 Obesity, unspecified: Secondary | ICD-10-CM

## 2019-09-08 DIAGNOSIS — Z789 Other specified health status: Secondary | ICD-10-CM

## 2019-09-08 DIAGNOSIS — O0992 Supervision of high risk pregnancy, unspecified, second trimester: Secondary | ICD-10-CM

## 2019-09-08 DIAGNOSIS — Z362 Encounter for other antenatal screening follow-up: Secondary | ICD-10-CM

## 2019-09-08 DIAGNOSIS — O09529 Supervision of elderly multigravida, unspecified trimester: Secondary | ICD-10-CM

## 2019-09-08 NOTE — Progress Notes (Signed)
   PRENATAL VISIT NOTE  Subjective:  Debbie Strickland is a 39 y.o. G3P2002 at [redacted]w[redacted]d being seen today for ongoing prenatal care.  She is currently monitored for the following issues for this high-risk pregnancy and has Supervision of high risk pregnancy, antepartum; AMA (advanced maternal age) multigravida 35+; Obesity in pregnancy; HGSIL (high grade squamous intraepithelial lesion) on Pap smear of cervix; BMI 40.0-44.9, adult (HCC); and Language barrier on their problem list.  Patient reports no complaints.  Contractions: Not present. Vag. Bleeding: None.  Movement: Present. Denies leaking of fluid.   The following portions of the patient's history were reviewed and updated as appropriate: allergies, current medications, past family history, past medical history, past social history, past surgical history and problem list.   Objective:   Vitals:   09/08/19 1037  BP: 117/66  Pulse: 73  Weight: 202 lb 14.4 oz (92 kg)    Fetal Status: Fetal Heart Rate (bpm): 144 Fundal Height: 26 cm Movement: Present     General:  Alert, oriented and cooperative. Patient is in no acute distress.  Skin: Skin is warm and dry. No rash noted.   Cardiovascular: Normal heart rate noted  Respiratory: Normal respiratory effort, no problems with respiration noted  Abdomen: Soft, gravid, appropriate for gestational age.  Pain/Pressure: Absent     Pelvic: Cervical exam deferred        Extremities: Normal range of motion.  Edema: None  Mental Status: Normal mood and affect. Normal behavior. Normal judgment and thought content.   Assessment and Plan:  Pregnancy: G3P2002 at [redacted]w[redacted]d [place] was seen today for routine prenatal visit.  Diagnoses and all orders for this visit:  Supervision of high risk pregnancy, antepartum - RTC in 4 weeks for 28w labs - HGSIL: repeat Colpo PP - Girl, breast, Depo - Discussed contraceptive options   Language barrier - encounter conducted in Spanish  Antepartum  multigravida of advanced maternal age - [place] per MFM, likely IOL by at least 39w - Next [place] in 8 weeks   Obesity in pregnancy Wt Readings from Last 3 Encounters:  09/08/19 202 lb 14.4 oz (92 kg)  08/12/19 201 lb 4.8 oz (91.3 kg)  07/30/19 200 lb 4.8 oz (90.9 kg)   Preterm labor symptoms and general obstetric precautions including but not limited to vaginal bleeding, contractions, leaking of fluid and fetal movement were reviewed in detail with the patient. Please refer to After Visit Summary for other counseling recommendations.   Return in about 4 weeks (around 10/06/2019) for HROB: in-person for 28 week labs.  Future Appointments  Date Time Provider Department Center  11/03/2019 10:45 AM [facility] US5 [facility]    Joselyn Arrow, MD

## 2019-10-07 ENCOUNTER — Ambulatory Visit (INDEPENDENT_AMBULATORY_CARE_PROVIDER_SITE_OTHER): Payer: No Typology Code available for payment source | Admitting: Certified Nurse Midwife

## 2019-10-07 ENCOUNTER — Other Ambulatory Visit: Payer: No Typology Code available for payment source

## 2019-10-07 ENCOUNTER — Other Ambulatory Visit: Payer: Self-pay

## 2019-10-07 DIAGNOSIS — Z23 Encounter for immunization: Secondary | ICD-10-CM

## 2019-10-07 DIAGNOSIS — O99213 Obesity complicating pregnancy, third trimester: Secondary | ICD-10-CM

## 2019-10-07 DIAGNOSIS — O0990 Supervision of high risk pregnancy, unspecified, unspecified trimester: Secondary | ICD-10-CM

## 2019-10-07 DIAGNOSIS — O0993 Supervision of high risk pregnancy, unspecified, third trimester: Secondary | ICD-10-CM

## 2019-10-07 DIAGNOSIS — E669 Obesity, unspecified: Secondary | ICD-10-CM

## 2019-10-07 DIAGNOSIS — Z3A28 28 weeks gestation of pregnancy: Secondary | ICD-10-CM

## 2019-10-07 NOTE — Progress Notes (Signed)
   PRENATAL VISIT NOTE  Subjective:  Debbie Strickland is a 39 y.o. G3P2002 at [redacted]w[redacted]d being seen today for ongoing prenatal care.  She is currently monitored for the following issues for this high-risk pregnancy and has Supervision of high risk pregnancy, antepartum; AMA (advanced maternal age) multigravida 35+; Obesity in pregnancy; HGSIL (high grade squamous intraepithelial lesion) on Pap smear of cervix; BMI 40.0-44.9, adult (HCC); and Language barrier on their problem list.  Patient reports no complaints. She had questions about whether she would require a Cesarean due to her high risk status. She has no previous history of CS deliveries. Discussed indications for Cesarean deliveries, and that we expect her to deliver vaginally. Discussed need for healthy weight gain, healthy diet, and ways to keep blood pressure under control.  Pt verbalized understanding.  Contractions: Not present. Vag. Bleeding: None.  Movement: Present. Denies leaking of fluid.   The following portions of the patient's history were reviewed and updated as appropriate: allergies, current medications, past family history, past medical history, past social history, past surgical history and problem list.   Video interpreter used: Gloriann Loan [id number] Objective:   Vitals:   10/07/19 0926  BP: 111/74  Pulse: 89  Weight: 204 lb 8 oz (92.8 kg)    Fetal Status: Fetal Heart Rate (bpm): 139 Fundal Height: 30 cm Movement: Present     General:  Alert, oriented and cooperative. Patient is in no acute distress.  Skin: Skin is warm and dry. No rash noted.   Cardiovascular: Normal heart rate noted  Respiratory: Normal respiratory effort, no problems with respiration noted  Abdomen: Soft, gravid, appropriate for gestational age.  Pain/Pressure: Present     Pelvic: Cervical exam deferred        Extremities: Normal range of motion.  Edema: None  Mental Status: Normal mood and affect. Normal behavior. Normal judgment and thought  content.   Assessment and Plan:  Pregnancy: G3P2002 at [redacted]w[redacted]d 1. Supervision of high risk pregnancy, antepartum - CBC - Glucose Tolerance, 2 Hours w/1 Hour - HIV Antibody (routine testing w rflx) - RPR - Tdap vaccine greater than or equal to 7yo IM  Preterm labor symptoms and general obstetric precautions including but not limited to vaginal bleeding, contractions, leaking of fluid and fetal movement were reviewed in detail with the patient. Please refer to After Visit Summary for other counseling recommendations.   Return in about 2 weeks (around 10/21/2019) for HROB.  Future Appointments  Date Time Provider Department Center  11/03/2019 10:45 AM [facility] NURSE [facility]  11/03/2019 10:45 AM [facility] US5 [facility]    Bernerd Limbo, CNM

## 2019-10-07 NOTE — Patient Instructions (Signed)
Aumento de peso saludable durante Firefighter en adultos Healthy [employer] During Pregnancy, Adult Un aumento de cierta cantidad de peso durante el embarazo es saludable y normal. La cantidad de peso que se espera que aumente depende de su salud general y de una medicin llamada IMC (ndice de masa muscular). El [facility] proporciona una estimacin de la grasa corporal basndose en su peso y Barrister's clerk. Para calcular su IMC, puede usar una calculadora en lnea o puede pedirle a su mdico que lo calcule por usted en su prxima visita. La cantidad de peso que es recomendable aumentar depende de su IMC antes del embarazo. Las pautas generales para el aumento de peso saludable total durante el embarazo se detallan a continuacin. Si su IMC al comienzo o antes de su embarazo es de:  Menos de 18,5 (bajo peso), debe aumentar de 28 a 40 libras (13 a 18kg).  De 18,5 a 24,9 (normal), debe aumentar de 25 a 35libras (11 a 16kg).  De 25 a 29,9 (sobrepeso), debe aumentar de 15 a 25libras (7 a 11kg).  De 30 o ms (obesa), debe aumentar de 11 a 20libras (5 a 9kg). Estos rangos varan segn Administrator, arts. Si est embarazada de ms de un beb (embarazo mltiple), puede ser seguro aumentar ms peso de lo que se recomienda aqu. Si aumenta menos peso de lo que se recomienda, podra ser seguro siempre y cuando su beb est creciendo y desarrollndose con normalidad. Cmo puede afectarme a m y a mi beb un aumento de peso no saludable? [place] Aliment demasiado de peso durante el embarazo puede producir complicaciones en la gestacin, por ejemplo:  Una forma temporal de diabetes que aparece durante el embarazo (diabetes gestacional).  Hipertensin arterial durante el embarazo y protenas en la orina (preeclampsia).  Hipertensin arterial durante el embarazo sin protenas en la orina (hipertensin gestacional).  Un beb con alto peso al nacer puede: ? Aumentar el riesgo de tener un parto ms difcil o necesitar un  parto quirrgico (parto por cesrea). ? Aumentar el riesgo de que el nio sufra de obesidad durante la niez. No aumentar el peso suficiente puede poner en peligro la vida del beb y puede aumentar las posibilidades de que el beb:  Nazca antes de tiempo (parto prematuro).  Crezca ms lento de lo normal durante el embarazo (restriccin del crecimiento).  Tenga bajo peso al [facility]. Qu puedo hacer para aumentar una cantidad saludable de peso durante el embarazo? Instrucciones generales  [employer] un seguimiento del aumento de peso durante el [place].  Tome los medicamentos de venta libre y los recetados solamente como se lo haya indicado el mdico. Tome todos los suplementos prenatales segn las indicaciones.  Concurra a todas las visitas de atencin durante el embarazo (visitas prenatales). Estas visitas son el momento adecuado para hablar sobre el aumento de [place]. Su mdico la pesar en cada visita para asegurarse de que est aumentando una cantidad de peso saludable. Nutricin   Consuma una dieta equilibrada y rica en nutrientes. Consuma gran cantidad de lo siguiente: ? Frutas y verduras, como frutos rojos y brcoli. ? Cereales integrales, como mijo, cebada, pan de salvado o integral, cereales y avena. ? Productos lcteos descremados o productos no lcteos como leche de [place] o de Surveyor, minerals. ? Alimentos proteicos como carnes [place], pollo, [place] y legumbres (como guisantes, frijoles, porotos de soja, y Therapist, occupational).  Evite alimentos fritos o que contengan mucha grasa, sal (sodio) o azcar.  Beba suficiente lquido para Photographer orina de color  amarillo plido.  Elija bocadillos y bebidas saludables cuando est en el trabajo o fuera de casa: ? Beba agua. Evite los refrescos, las bebidas deportivas y los jugos que contengan azcar aadido. ? Evite las bebidas con cafena, como el caf y las bebidas energizantes. ? Coma colaciones con alto contenido proteico, como nueces, barras proteicas  y yogures descremados. ? Lleve consigo bocadillos prcticos que no necesiten refrigeracin, como un paquete de frutos secos, una manzana o una barra de granola.  Si necesita ayuda para mejorar su dieta, hable con su mdico o con un especialista en nutricin(nutricionista). Actividad   Realice ejercicio con regularidad como se lo haya indicado el mdico. ? Si era una persona activa antes de quedar [place], es posible que pueda continuar sus actividades fsicas habituales. ? Si no era activa antes del embarazo, podra gradualmente llegar a hacer de actividad o ms la [employer] de la [place]. Las actividades pueden incluir caminar, Designer, industrial/product yoga.  Pregntele al mdico qu actividades son seguras para usted. Hable con su mdico sobre si debera Social research officer, government en la escuela o en el trabajo. Dnde encontrar ms informacin: Infrmese sobre cmo Dietitian aumento de peso durante el embarazo en:  Asociacin Americana del Oracle (American Pregnancy Association): BroadwayMovies.se.  Biomedical engineer de aumento de peso en el embarazo del Departamento de Agricultura de los [place]idos: https://ball-collins.biz/ Resumen  Subir demasiado de peso durante el embarazo puede causar complicaciones para usted y para su beb.  Averige su IMC antes del embarazo para determinar el aumento de peso que es saludable para usted.  Consuma alimentos nutritivos y [employer].  Concurra a todas las visitas prenatales tal como se lo haya indicado el mdico. Esta informacin no tiene Theme park manager el consejo del mdico. Asegrese de hacerle al mdico cualquier pregunta que tenga. Document Revised: 03/17/2017 Document Reviewed: 03/17/2017 Elsevier Patient Education  2020 [employer].

## 2019-10-08 LAB — GLUCOSE TOLERANCE, 2 HOURS W/ 1HR
Glucose, 1 hour: 210 mg/dL — ABNORMAL HIGH (ref 65–179)
Glucose, 2 hour: 140 mg/dL (ref 65–152)
Glucose, Fasting: 93 mg/dL — ABNORMAL HIGH (ref 65–91)

## 2019-10-08 LAB — CBC
Hematocrit: 37.1 % (ref 34.0–46.6)
Hemoglobin: 11.8 g/dL (ref 11.1–15.9)
MCH: 26.8 pg (ref 26.6–33.0)
MCHC: 31.8 g/dL (ref 31.5–35.7)
MCV: 84 fL (ref 79–97)
Platelets: 264 10*3/uL (ref 150–450)
RBC: 4.4 x10E6/uL (ref 3.77–5.28)
RDW: 13.5 % (ref 11.7–15.4)
WBC: 11.2 10*3/uL — ABNORMAL HIGH (ref 3.4–10.8)

## 2019-10-08 LAB — HIV ANTIBODY (ROUTINE TESTING W REFLEX): HIV Screen 4th Generation wRfx: NONREACTIVE

## 2019-10-08 LAB — RPR: RPR Ser Ql: NONREACTIVE

## 2019-10-10 DIAGNOSIS — Z8632 Personal history of gestational diabetes: Secondary | ICD-10-CM | POA: Insufficient documentation

## 2019-10-13 ENCOUNTER — Telehealth: Payer: Self-pay

## 2019-10-13 NOTE — Telephone Encounter (Addendum)
-----   Message from Gerrit Heck, [place] sent at 10/10/2019  6:18 AM EDT ----- Please call patient and inform of abnormal glucola screen, send supplies to pharmacy, and schedule for diabetic education.  Thanks, JE  Attempted to contact pt with Spanish Interpreter Alisa and left message x 2  that I am call with results and f/u appt if she could please give the office a call.    Addison Naegeli, RN

## 2019-10-22 ENCOUNTER — Other Ambulatory Visit: Payer: Self-pay

## 2019-10-22 ENCOUNTER — Ambulatory Visit (INDEPENDENT_AMBULATORY_CARE_PROVIDER_SITE_OTHER): Payer: No Typology Code available for payment source | Admitting: Obstetrics and Gynecology

## 2019-10-22 VITALS — BP 123/79 | HR 87 | Wt 205.5 lb

## 2019-10-22 DIAGNOSIS — R87613 High grade squamous intraepithelial lesion on cytologic smear of cervix (HGSIL): Secondary | ICD-10-CM

## 2019-10-22 DIAGNOSIS — O24420 Gestational diabetes mellitus in childbirth, diet controlled: Secondary | ICD-10-CM

## 2019-10-22 DIAGNOSIS — Z8759 Personal history of other complications of pregnancy, childbirth and the puerperium: Secondary | ICD-10-CM

## 2019-10-22 DIAGNOSIS — E669 Obesity, unspecified: Secondary | ICD-10-CM

## 2019-10-22 DIAGNOSIS — O0990 Supervision of high risk pregnancy, unspecified, unspecified trimester: Secondary | ICD-10-CM

## 2019-10-22 DIAGNOSIS — O09523 Supervision of elderly multigravida, third trimester: Secondary | ICD-10-CM

## 2019-10-22 DIAGNOSIS — Z6841 Body Mass Index (BMI) 40.0 and over, adult: Secondary | ICD-10-CM

## 2019-10-22 DIAGNOSIS — O99213 Obesity complicating pregnancy, third trimester: Secondary | ICD-10-CM

## 2019-10-22 DIAGNOSIS — Z3A30 30 weeks gestation of pregnancy: Secondary | ICD-10-CM

## 2019-10-22 DIAGNOSIS — Z603 Acculturation difficulty: Secondary | ICD-10-CM

## 2019-10-22 DIAGNOSIS — L237 Allergic contact dermatitis due to plants, except food: Secondary | ICD-10-CM

## 2019-10-22 DIAGNOSIS — Z789 Other specified health status: Secondary | ICD-10-CM

## 2019-10-22 DIAGNOSIS — O0993 Supervision of high risk pregnancy, unspecified, third trimester: Secondary | ICD-10-CM

## 2019-10-22 DIAGNOSIS — O99210 Obesity complicating pregnancy, unspecified trimester: Secondary | ICD-10-CM

## 2019-10-22 MED ORDER — HYDROCORTISONE 1 % EX LOTN
1.0000 | TOPICAL_LOTION | Freq: Two times a day (BID) | CUTANEOUS | 0 refills | Status: AC
Start: 2019-10-22 — End: 2019-10-29

## 2019-10-22 MED ORDER — DIPHENHYDRAMINE-ZINC ACETATE 2-0.1 % EX CREA: 1.0000 "application " | TOPICAL_CREAM | Freq: Three times a day (TID) | CUTANEOUS | 0 refills | Status: DC | PRN

## 2019-10-22 MED ORDER — HYDROCORTISONE 1 % EX LOTN
1.0000 | TOPICAL_LOTION | Freq: Two times a day (BID) | CUTANEOUS | 0 refills | Status: DC
Start: 2019-10-22 — End: 2019-10-22

## 2019-10-22 NOTE — Progress Notes (Signed)
Prenatal Visit Note Date: 10/22/2019 Clinic: [facility]  Subjective:  Albert Devaul is a 39 y.o. G3P2002 at [redacted]w[redacted]d being seen today for ongoing prenatal care.  She is currently monitored for the following issues for this high-risk pregnancy and has Supervision of high risk pregnancy, antepartum; AMA (advanced maternal age) multigravida 35+; Obesity in pregnancy; HGSIL (high grade squamous intraepithelial lesion) on Pap smear of cervix; BMI 40.0-44.9, adult (HCC); Language barrier; and Gestational diabetes mellitus in childbirth, diet controlled on their problem list.  Patient reports ?rash from plant above umbilicus. Pt denies any bug bites, ticks in that area. No fever, chills  Contractions: Not present. Vag. Bleeding: None.  Movement: Present. Denies leaking of fluid.   The following portions of the patient's history were reviewed and updated as appropriate: allergies, current medications, past family history, past medical history, past social history, past surgical history and problem list. Problem list updated.  Objective:   Vitals:   10/22/19 1054  BP: 123/79  Pulse: 87  Weight: 205 lb 8 oz (93.2 kg)    Fetal Status: Fetal Heart Rate (bpm): 143 Fundal Height: 31 cm Movement: Present     General:  Alert, oriented and cooperative. Patient is in no acute distress.  Skin: Skin is warm and dry. No rash noted.   Cardiovascular: Normal heart rate noted  Respiratory: Normal respiratory effort, no problems with respiration noted  Abdomen: Soft, gravid, appropriate for gestational age. Pain/Pressure: Present  4cm circular area above umbilicus with erythema and some dimpling c/w a poison ivy    Pelvic:  Cervical exam deferred        Extremities: Normal range of motion.  Edema: Trace  Mental Status: Normal mood and affect. Normal behavior. Normal judgment and thought content.   Urinalysis:      Assessment and Plan:  Pregnancy: G3P2002 at [redacted]w[redacted]d  1.  Gestational diabetes mellitus in childbirth, diet controlled Has not had DM education visit. Request made for ASAP visit Schedule 36-37wk surveillance growth u/s nv  2. HGSIL (high grade squamous intraepithelial lesion) on Pap smear of cervix Rpt suveillance colpo to be scheduled. Importance of monitoring this and treating it after delivery d/w her in terms of pre-cancerous cells that can progress to cancer  3. Supervision of high risk pregnancy, antepartum Routine care  4. Multigravida of advanced maternal age in third trimester  5. Language barrier Interpreter used  6. BMI 40.0-44.9, adult (HCC)  7. Obesity in pregnancy  8. Poison ivy dermatitis OTCs advised.   Preterm labor symptoms and general obstetric precautions including but not limited to vaginal bleeding, contractions, leaking of fluid and fetal movement were reviewed in detail with the patient. Please refer to After Visit Summary for other counseling recommendations.  Needs asap visit with diabetes education Needs asap colpo 2wk hrob in person   [place] Bing, MD

## 2019-10-22 NOTE — Progress Notes (Signed)
.  rout 

## 2019-10-23 LAB — POCT URINALYSIS DIP (DEVICE)
Bilirubin Urine: NEGATIVE
Glucose, UA: NEGATIVE mg/dL
Ketones, ur: NEGATIVE mg/dL
Leukocytes,Ua: NEGATIVE
Nitrite: NEGATIVE
Protein, ur: NEGATIVE mg/dL
Specific Gravity, Urine: 1.02 (ref 1.005–1.030)
Urobilinogen, UA: 0.2 mg/dL (ref 0.0–1.0)
pH: 7 (ref 5.0–8.0)

## 2019-11-03 ENCOUNTER — Ambulatory Visit: Payer: No Typology Code available for payment source

## 2019-11-03 ENCOUNTER — Other Ambulatory Visit: Payer: No Typology Code available for payment source

## 2019-11-06 ENCOUNTER — Other Ambulatory Visit: Payer: Self-pay

## 2019-11-06 ENCOUNTER — Ambulatory Visit: Payer: No Typology Code available for payment source | Attending: Obstetrics and Gynecology

## 2019-11-06 ENCOUNTER — Encounter: Payer: Self-pay | Admitting: *Deleted

## 2019-11-06 ENCOUNTER — Ambulatory Visit: Payer: No Typology Code available for payment source | Admitting: *Deleted

## 2019-11-06 ENCOUNTER — Other Ambulatory Visit: Payer: Self-pay | Admitting: *Deleted

## 2019-11-06 DIAGNOSIS — O99210 Obesity complicating pregnancy, unspecified trimester: Secondary | ICD-10-CM | POA: Insufficient documentation

## 2019-11-06 DIAGNOSIS — O09522 Supervision of elderly multigravida, second trimester: Secondary | ICD-10-CM

## 2019-11-06 DIAGNOSIS — Z362 Encounter for other antenatal screening follow-up: Secondary | ICD-10-CM

## 2019-11-06 DIAGNOSIS — O99212 Obesity complicating pregnancy, second trimester: Secondary | ICD-10-CM

## 2019-11-06 DIAGNOSIS — Z3A32 32 weeks gestation of pregnancy: Secondary | ICD-10-CM

## 2019-11-06 DIAGNOSIS — O09529 Supervision of elderly multigravida, unspecified trimester: Secondary | ICD-10-CM | POA: Insufficient documentation

## 2019-11-06 DIAGNOSIS — O0990 Supervision of high risk pregnancy, unspecified, unspecified trimester: Secondary | ICD-10-CM | POA: Insufficient documentation

## 2019-11-06 DIAGNOSIS — O24410 Gestational diabetes mellitus in pregnancy, diet controlled: Secondary | ICD-10-CM

## 2019-11-09 ENCOUNTER — Ambulatory Visit (INDEPENDENT_AMBULATORY_CARE_PROVIDER_SITE_OTHER): Payer: No Typology Code available for payment source | Admitting: Obstetrics & Gynecology

## 2019-11-09 ENCOUNTER — Other Ambulatory Visit: Payer: Self-pay

## 2019-11-09 VITALS — BP 121/72 | HR 89 | Wt 208.0 lb

## 2019-11-09 DIAGNOSIS — R87613 High grade squamous intraepithelial lesion on cytologic smear of cervix (HGSIL): Secondary | ICD-10-CM

## 2019-11-09 DIAGNOSIS — O99210 Obesity complicating pregnancy, unspecified trimester: Secondary | ICD-10-CM

## 2019-11-09 DIAGNOSIS — O09523 Supervision of elderly multigravida, third trimester: Secondary | ICD-10-CM

## 2019-11-09 DIAGNOSIS — O0990 Supervision of high risk pregnancy, unspecified, unspecified trimester: Secondary | ICD-10-CM

## 2019-11-09 DIAGNOSIS — O24420 Gestational diabetes mellitus in childbirth, diet controlled: Secondary | ICD-10-CM

## 2019-11-09 NOTE — Progress Notes (Signed)
° °  PRENATAL VISIT NOTE  Subjective:  Debbie Strickland is a 39 y.o. G3P2002 at [redacted]w[redacted]d being seen today for ongoing prenatal care.  She is currently monitored for the following issues for this high-risk pregnancy and has Supervision of high risk pregnancy, antepartum; AMA (advanced maternal age) multigravida 35+; Obesity in pregnancy; HGSIL (high grade squamous intraepithelial lesion) on Pap smear of cervix; BMI 40.0-44.9, adult (HCC); Language barrier; and Gestational diabetes mellitus in childbirth, diet controlled on their problem list.  Patient reports no complaints.  Contractions: Not present. Vag. Bleeding: None.  Movement: Present. Denies leaking of fluid.   The following portions of the patient's history were reviewed and updated as appropriate: allergies, current medications, past family history, past medical history, past social history, past surgical history and problem list.   Objective:   Vitals:   11/09/19 1604  BP: 121/72  Pulse: 89  Weight: 208 lb (94.3 kg)    Fetal Status: Fetal Heart Rate (bpm): 132   Movement: Present     General:  Alert, oriented and cooperative. Patient is in no acute distress.  Skin: Skin is warm and dry. No rash noted.   Cardiovascular: Normal heart rate noted  Respiratory: Normal respiratory effort, no problems with respiration noted  Abdomen: Soft, gravid, appropriate for gestational age.  Pain/Pressure: Present     Pelvic: Cervical exam deferred        Extremities: Normal range of motion.  Edema: Trace  Mental Status: Normal mood and affect. Normal behavior. Normal judgment and thought content.   Assessment and Plan:  Pregnancy: G3P2002 at [redacted]w[redacted]d 1. Supervision of high risk pregnancy, antepartum GDM diet   2. Multigravida of advanced maternal age in third trimester   3. Obesity in pregnancy HGSIL (high grade squamous intraepithelial lesion) on Pap smear of cervix  Supervision of high risk pregnancy, antepartum  Multigravida of  advanced maternal age in third trimester  Obesity in pregnancy  Gestational diabetes mellitus in childbirth, diet controlled  Good control reported See colpo note, no Bx Preterm labor symptoms and general obstetric precautions including but not limited to vaginal bleeding, contractions, leaking of fluid and fetal movement were reviewed in detail with the patient. Please refer to After Visit Summary for other counseling recommendations.   Return in about 2 weeks (around 11/23/2019).  Future Appointments  Date Time Provider Department Center  11/12/2019  8:15 AM [facility]  12/04/2019  1:00 PM [facility] NURSE [facility]  12/04/2019  1:15 PM [facility] US2 [facility]    Scheryl Darter, MD

## 2019-11-09 NOTE — Patient Instructions (Signed)
Diabetes mellitus gestacional, diagnstico Gestational Diabetes Mellitus, Diagnosis La diabetes gestacional (diabetes mellitus gestacional) es una forma de diabetes a corto plazo (temporal) que puede presentarse durante Water quality scientist. Este cuadro desaparece despus del parto. Puede deberse a uno de [employer] o a ambos:  El pncreas no produce suficiente cantidad de una hormona llamada insulina.  El cuerpo no responde de forma normal a la insulina que produce. La insulina permite que los ciertos azcares (glucosa) ingresen a las clulas del cuerpo. Esto le proporciona energa. Si tiene diabetes, los azcares no pueden ingresar a las clulas. Esto produce un aumento del nivel de Dispensing optician (hiperglucemia). Si tiene diabetes gestacional:  Es ms probable que vuelva a tenerla si queda embarazada nuevamente.  Es ms probable que desarrolle diabetes tipo2 en el futuro. Si la diabetes gestacional se trata, es posible que ni usted ni el beb se vean afectados. El mdico fijar los objetivos del tratamiento para usted. En general, los resultados de los niveles de azcar en la sangre deben ser los siguientes:  Despus de no comer durante mucho tiempo (ayunar): 95mg /dl (5,71mmol/l).  Despus de las comidas (posprandial): ? Una hora despus de una comida: igual o menor que 140mg /dl (7,47mmol/l). ? Dos horas despus de una comida: igual o menor que 120mg /dl (6,66mmol/l).  Nivel de A1c (hemoglobinaA1c): del 6% al 6,5%. Siga estas indicaciones en su casa: Preguntas para hacerle al mdico   Puede hacer las siguientes preguntas: ? Es necesario que consulte a Radio broadcast assistant en el cuidado de la diabetes? ? Qu equipos necesitar para cuidarme en casa? ? Qu medicamentos necesito? Cundo debo tomarlos? ? Con qu frecuencia debo controlar mi nivel de azcar en la sangre? ? A qu nmero puedo llamar si tengo preguntas? ? Cundo es mi prxima cita con el mdico? Instrucciones  generales  [employer] de venta libre y los recetados solamente como se lo haya indicado el mdico.  Mantenga un peso saludable durante el [place].  Concurra a todas las visitas de seguimiento como se lo haya indicado el mdico. Esto es importante. Comunquese con un mdico si:  Su nivel de azcar en la sangre es igual o mayor que 240mg /dl (13,9mmol/dl).  Su nivel de azcar en la sangre es igual o mayor que 200mg /dl (11,28mmol/l), y tiene cetonas en la orina.  Ha estado enferma o ha tenido fiebre durante ms de 2 das y no [place].  Tiene alguno de estos problemas durante ms de 6horas: ? No puede comer ni beber. ? Siente malestar estomacal (nuseas). ? Vomita. ? Presenta heces lquidas (diarrea). Solicite ayuda de inmediato si:  El nivel de azcar en la sangre est por debajo de 54mg /dl (53mmol/l).  Se siente confundida.  Tiene dificultad para hacer lo siguiente: ? Pensar con claridad. ? Respirar.  El beb se mueve menos de lo normal.  Tiene alguno de estos sntomas: ? Niveles moderados o altos de cetonas en la orina. ? Sangre proveniente de la vagina. ? Secrecin de un lquido fuera de lo comn de la vagina. ? Contracciones prematuras. Estas pueden causar una sensacin de opresin en el vientre. Resumen  La diabetes gestacional es una forma de diabetes a corto plazo. Puede suceder mientras est embarazada. Este cuadro desaparece despus del parto.  Si la diabetes gestacional se trata, es posible que ni usted ni el beb se vean afectados. El mdico fijar los objetivos del tratamiento para usted.  Concurra a todas las visitas de seguimiento como se lo haya indicado el mdico.  Esto es importante. Esta informacin no tiene Theme park manager el consejo del mdico. Asegrese de hacerle al mdico cualquier pregunta que tenga. Document Revised: 01/22/2017 Document Reviewed: 04/29/2015 Elsevier Patient Education  2020 [employer].

## 2019-11-09 NOTE — Progress Notes (Signed)
Patient given informed consent, signed copy in the chart, time out was performed.  Placed in lithotomy position. Cervix viewed with speculum and colposcope after application of acetic acid.   Colposcopy adequate?  yes Acetowhite lesions? no Punctation? no Mosaicism?  no Abnormal vasculature?  no Biopsies? no ECC? no  COMMENTS:  Repeat colposcopy 8 weeks PP  Scheryl Darter, MD

## 2019-11-12 ENCOUNTER — Encounter: Payer: No Typology Code available for payment source | Attending: Obstetrics & Gynecology | Admitting: Registered"

## 2019-11-12 ENCOUNTER — Other Ambulatory Visit: Payer: Self-pay

## 2019-11-12 ENCOUNTER — Ambulatory Visit: Payer: No Typology Code available for payment source | Admitting: Registered"

## 2019-11-12 DIAGNOSIS — O24420 Gestational diabetes mellitus in childbirth, diet controlled: Secondary | ICD-10-CM

## 2019-11-12 DIAGNOSIS — Z3A00 Weeks of gestation of pregnancy not specified: Secondary | ICD-10-CM | POA: Insufficient documentation

## 2019-11-12 DIAGNOSIS — O24419 Gestational diabetes mellitus in pregnancy, unspecified control: Secondary | ICD-10-CM | POA: Insufficient documentation

## 2019-11-12 NOTE — Progress Notes (Signed)
Interpreter services provided by [facility] #[id number] & Luanna Cole #[id number] from [place]  Patient was seen on 11/12/19 for Gestational Diabetes self-management. EDD 9/20/212. Patient states no history of GDM. Diet history obtained. Patient eats variety of all food groups. Beverages include water, juice, milk.  Patient has started restricting her intake due to GDM dx.  The following learning objectives were met by the patient :   States the definition of Gestational Diabetes  States why dietary management is important in controlling blood glucose  Describes the effects of carbohydrates on blood glucose levels  Demonstrates ability to create a balanced meal plan  Demonstrates carbohydrate counting   States when to check blood glucose levels  Demonstrates proper blood glucose monitoring techniques  States the effect of stress and exercise on blood glucose levels  States the importance of limiting caffeine and abstaining from alcohol and smoking  Plan:  Aim for 3 Carbohydrate Choices per meal (45 grams) +/- 1 either way  Aim for 1-2 Carbohydrate Choices per snack Begin reading food labels for Total Carbohydrate of foods If OK with your MD, consider  increasing your activity level by walking, Arm Chair Exercises or other activity daily as tolerated Begin checking Blood Glucose before breakfast and 2 hours after first bite of breakfast, lunch and dinner as directed by MD  Bring Log Book/Sheet and meter to every medical appointment  Baby Scripts: Patient not appropriate for Baby Scripts due to  language barrier Take medication if directed by MD  Blood glucose monitor given: [employer] # [id number] Blood glucose reading: 78 mg/dL (fasting)  Patient instructed to monitor glucose levels: FBS: 60 - 95 mg/dl 2 hour: <120 mg/dl  Patient received the following handouts:  Nutrition Diabetes and Pregnancy  Carbohydrate Counting List  Blood glucose Log Sheet  Patient will be seen for  follow-up as needed.

## 2019-11-24 ENCOUNTER — Ambulatory Visit (INDEPENDENT_AMBULATORY_CARE_PROVIDER_SITE_OTHER): Payer: No Typology Code available for payment source | Admitting: Obstetrics and Gynecology

## 2019-11-24 ENCOUNTER — Other Ambulatory Visit: Payer: Self-pay

## 2019-11-24 VITALS — BP 134/69 | HR 71 | Wt 207.8 lb

## 2019-11-24 DIAGNOSIS — R87613 High grade squamous intraepithelial lesion on cytologic smear of cervix (HGSIL): Secondary | ICD-10-CM

## 2019-11-24 DIAGNOSIS — Z3A39 39 weeks gestation of pregnancy: Secondary | ICD-10-CM | POA: Insufficient documentation

## 2019-11-24 DIAGNOSIS — Z3A35 35 weeks gestation of pregnancy: Secondary | ICD-10-CM

## 2019-11-24 DIAGNOSIS — Z6841 Body Mass Index (BMI) 40.0 and over, adult: Secondary | ICD-10-CM

## 2019-11-24 DIAGNOSIS — O0990 Supervision of high risk pregnancy, unspecified, unspecified trimester: Secondary | ICD-10-CM

## 2019-11-24 DIAGNOSIS — Z789 Other specified health status: Secondary | ICD-10-CM

## 2019-11-24 DIAGNOSIS — O24420 Gestational diabetes mellitus in childbirth, diet controlled: Secondary | ICD-10-CM

## 2019-11-24 DIAGNOSIS — O99210 Obesity complicating pregnancy, unspecified trimester: Secondary | ICD-10-CM

## 2019-11-24 DIAGNOSIS — O09523 Supervision of elderly multigravida, third trimester: Secondary | ICD-10-CM

## 2019-11-24 NOTE — Patient Instructions (Addendum)
Diabetes mellitus gestacional, cuidados personales Gestational Diabetes Mellitus, Self Care Las mujeres que tienen diabetes gestacional (diabetes mellitus gestacional) deben mantener su nivel de azcar en la sangre (glucosa) dentro de un rango saludable. Es posible hacerlo por medio de lo siguiente:  Alimentacin.  Actividad fsica.  Cambios en el estilo de vida.  Medicamentos o insulina, si es necesario.  [employer] Company mdicos y de Producer, television/film/video. Si recibe tratamiento para esta afeccin, es posible que ni usted ni su beb en gestacin (feto) se vean afectados. Si no recibe tratamiento, esta afeccin puede causar problemas que pueden ser perjudiciales para usted o su beb en gestacin. Si tiene diabetes gestacional:  Es ms probable que vuelva a tenerla si queda embarazada nuevamente.  Es ms probable que desarrolle diabetes tipo2 en el futuro. Cmo mantenerse informada sobre Retail buyer de azcar en la sangre   Controle su nivel de azcar en la sangre todos los das durante el [place]. Haga los controles con la frecuencia que le hayan indicado.  Llame al mdico si el nivel de azcar en la sangre est por encima de las cifras ideales en dosanlisis seguidos. El mdico fijar objetivos de tratamiento personales para usted. Generalmente, los [employer] Corporation niveles de Location manager en la sangre deben ser los siguientes:  Antes de las comidas, o despus de no haber comido durante un tiempo prolongado (en ayunas o preprandial): igual o menor que 95 mg/dl (5,3 mmol/l).  Despus de las comidas (posprandial): ? Una hora despus de una comida: igual o menor que 128m/dl (7,834ml/l). ? Dos horas despus de una comida: igual o menor que 1204ml (6,7mm58ml).  Nivel de A1c (hemoglobinaA1c): del 6% al 6,5%. Cmo controlar los niveles altos y bajos de azcaLocation managerla sangre Signos de un nivel alto de azcar en la sangre Un nivel alto de azcar en la sangre se denomina hiperglucemia. Conozca  cules son los signos de un nivel alto de azcaDispensing opticians signos pueden incluir lo siguiente:  Sentir: ? Sed. ? Hambre. ? Mucho cansancio.  Necesidad de orinGarment/textile technologist mayor frecuencia que lo habitual.  Visin borrosa. Signos de un nivel bajo de azcar en la sangre Un nivel bajo de azcar en la sangre se denomina hipoglucemia. Este cuadro ocurre cuando el nivel de azcar en la sangre es igual o menor que 70mg33m(3,9mmol37m. Los signos pueden incluir lo siguiente:  Sentir: ? Hambre. ? Preocupacin o nervios (ansiedad). ? Sudoracin y piel hIntel Corporationnfusin. ? Mareos. ? Somnolencia. ? Ganas de vomitar (nuseas).  Tener: ? Latidos cardacos acelerados. ? Dolor de cabezaNetherlandsmbios en la visin. ? Hormigueo y falta de sensibilidad (entumecimiento) alrededor de la boca, labios o lengua. ? Movimientos espasmdicos que no puede controlar (convulsiones).  Dificultades para hacer lo siguiente: ? Moverse (coordinacin). ? Dormir. ? Desmayos. ? Molestarse con facilidad (irritabilidad). Tratamiento del nivel bajo de azcar en la sangre Para tratar un nivel bajo de azcar en la sangre, ingiera un alimento o una bebida azucarada de inmediato. Si puede pensar con claridad y tragar de manera segura, siga la regla 15/15, que consiste en lo siguiente:  Consuma 15gramos de un hidrato de carbono de accin rpida (carbohidrato). Hable con su mdico acerca de cunto debera consumir.  Algunos hidratos de carbono de accin rpida son: ? Comprimidos de azcar (pastillas de glucosa). Consuma 3o 4pastillas de glucosa. ? De 6 a 8unidades de caramelos duros. ? De 4 a 6onzas (de 120 a 150ml) 77mugo de frutas. ?  De 4 a 6onzas (de 120 a 134m) de refresco comn (no diettico). ? 1 cucharada (163m de miel o azcar.  Contrlese el nivel de azcar en la sangre 1536mtos despus de ingerir el hidrato de carbono.  Si el nivel de azcar en la sangre todava es igual o menor que  20m8m (3,9mmo25m), ingiera nuevamente 15gramos de un hidrato de carbono.  Si el nivel de azcar en la sangre no supera los 20mg/37m3,9mmol/35mdespus de 3intentos, solicite ayuda de inmediato.  Ingiera una comida o una colacin en el transcurso de 1hora despus de que el nivel de azcar en la sangre se haya normalizado. Tratamiento del nivel muy bajo de azcar en la sangre Si el nivel de azcar en la sangre es igual o menor que 54mg/dl48mmol/l)41mignifica que est muy bajo (hipoglucemia grave). Esto es una emergEngineer, maintenance (IT)re a ver si los sntomas desaparecen. Solicite atencin mdica de inmediato. Comunquese con el servicio de emergencias de su localidad (911 en los [place]). Si su nivel de azcar en la sangre es muy bajo y no puede ingerir ningn alimento ni bebida, tal vez deba aplicarse una inyeccin de glucagn. Un familiar o un amigo deben aprender a controlarle el azcar en la sangre y a aplicarle una inyeccin de glucagn. Pregntele al mdico si debe tener un kit de inyecciones de glucagn en su casa. Siga estas indicaciones en su casa: Medicamentos  Aplquese la insulina y tome los medicamentos para la diabetes como se lo hayan indicado.  Si el mdico le indica que se aplique ms o menos insulina, o que tome ms o menos medicamentos, haga exactamente lo que le diga. LandAmerica Financialquede sin insulina o sin medicamentos. Alimentos   Opte por opciones de alimentos saludables. Estos incluyen los siguientes: ? Pollo, pescado, claras de huevos y frijoles. ? Avena, harina integral, trigo burgol, arroz integral, quinua y mijo. ? Frutas y Lambert Modyas frescas. ? Productos lcteos descremados. ? Frutos secos, aguacate, aceite de oliva y aceite de canola.  Consulte a un especialista en alimentacin (nutricionista). Este profesional puede ayudarla a elaborar Paediatric nursede alimentacin adecuado para usted.  Siga las indicaciones del mdico respecto de lo que no puede comer o  beber.  Beba suficiente lquido para mantener Contractororina) de color amarillo plido.  Ingiera refrigerios saludables entre comidas nutritivas.  Lleve un registro de los hidratos de carbono que consume. Para hacerlo, lea las etiquetas de informacin nutricional y aprenda cules son los tamaos de las porciones de los alimentos.  Siga su plan para los das de enfermedad cuando no pueda comer ni beber normalmente. Elabore eUnited Auto el mdico, de modo que est listo para usarlo. Actividad  Haga actividad fsica durante 30minutos34ms por da, o durante el tiempo que el mdico le rViacome.  Hable con el mdico antes de comenzar una rutina de ejercicio o actividad nueva. Es posible que el mdico le indique que haga cambios en: ? La cantidad de insulina qDover Corporation o los medicamentos que toma. ? Cunto debe comer. Estilo de vida  No beba alcohol.  No use productos que contengan tabaco. Estos incluyen cigarrillos, tabaco para mascar y cigarrilloPsychologist, sport and exerciseita ayuda para dejar de fumar, consulte al mdico.  Aprenda cmo sobrellevar el estrs. Si necesita ayuda para lograrlo, consulte al mdico. CuiMeadWestvacodel cuerpo  Mantngase al da con las vacunas (inmunizaciones).  Cepllese los dientes y las encas Rattanlo dental unaArdelia Mems  o ms veces por da.  Vaya al dentista una vez cada 42mses o con ms frecuencia.  Mantenga un peso sTax adviser Indicaciones generales  TDelphide venta libre y los recetados solamente como se lo haya indicado el mdico.  Pregntele al mdico sobre los riesgos de la presin arterial alta en el embarazo (preeclampsia y eclampsia).  Comparta su plan de atencin de la diabetes con: ? Sus compaeros de trabajo o de la escuela. ? LAnadarko Petroleum Corporationcon las que cMayo  Hgase pruebas de orina para dProduct managerpresencia de cetonas: ? Cuando est enferma. ? Como se lo haya indicado el  mdico.  Lleve consigo una tarjeta, o use un brazalete o una medalla que indique que tiene diabetes.  Concurra a todas las visitas de control como se lo haya indicado el mdico. Esto es importante. Cuidados despus del parto  Hgase controlar el nivel de azcar en la sangre 4 a 12semanas despus del parto.  Hgase controlar si tiene diabetes una o ms veces en el plazo de 3 aos. Preguntas para hacerle al mdico  Es necesario que me rena con uRadio broadcast assistanten el cuidado de la diabetes?  Dnde puedo encontrar un grupo de a44para mujeres con diabetes gestacional? Dnde buscar ms informacin Para obtener ms informacin sobre la diabetes, visite los siguientes sitios web:  American Diabetes Association (Asociacin Estadounidense de la Diabetes): www.diabetes.org  Centers for Disease Control and Prevention (Librarian, academic (Centros para eBuilding surveyory la Prevencin de EArboriculturist: whttp://www.wolf.info/Resumen  Controle su nivel de azcar en la sangre (glucosa) tEctor Haga los controles con la frecuencia que le hayan indicado.  Aplquese la insulina y tome los medicamentos para la diabetes como se lo hayan indicado.  Concurra a todas las visitas de control como se lo haya indicado el mdico. Esto es importante.  Hgase controlar el nivel de azcar en la sangre 4 a 12semanas despus del parto. Esta informacin no tiene cMarine scientistel consejo del mdico. Asegrese de hacerle al mdico cualquier pregunta que tenga. Document Revised: 11/07/2017 Document Reviewed: 11/07/2017 Elsevier Patient Education  2020 EElm Creektrimestre de eMedia plannerThird Trimester of Pregnancy  El tercer trimestre comprende desde la sInternational Business Machinesla [id number](desde el mes7 hasta el mes9). En este trimestre, el beb en gestacin (feto) crece muy rpidamente. Hacia el final del noveno mes, el beb en gestacin mide alrededor de 20pulgadas (45cm) de largo. Pesa entre 6y  10libras ([id number]. Siga estas indicaciones en su casa: Medicamentos  TDelphide venta libre y los recetados solamente como se lo haya indicado el mdico. Algunos medicamentos son seguros para tomar durante el eMedia plannery otros no lo son.  Tome vitaminas prenatales que contengan por lo menos [id number](?g) de cido flico.  Si tiene dificultad para mover el intestino (estreimiento), tome un medicamento para ablandar las heces (laxante) si su mdico se lo autoriza. Comida y bebida   Ingiera alimentos saludables de mSt. Peterregular.  No coma carne cruda ni quesos sin cocinar.  Si obtiene poca cantidad de calcio de los alimentos que ingiere, consulte a su mdico sobre la posibilidad de tomar un suplemento diario de calcio.  La ingesta diaria de cuatro o cinco comidas pequeas en lugar de tres comidas abundantes.  Evite el consumo de alimentos ricos en grasas y azcares, como los alimentos fritos y los dulces.  Para evitar el estreimiento: ? Consuma alimentos ricos en fibra, como frutas  y verduras frescas, cereales integrales y frijoles. ? Beba suficiente lquido para mantener el pis (orina) claro o de color amarillo plido. Actividad  Haga ejercicios solamente como se lo haya indicado el mdico. Interrumpa la actividad fsica si comienza a tener calambres.  No levante objetos pesados, use zapatos de tacones bajos y sintese derecha.  No haga ejercicio si hace demasiado calor, hay demasiada humedad o se encuentra en un lugar de mucha altura (altitud alta).  Puede continuar teniendo [employer], a menos que el mdico le indique lo contrario. Alivio del dolor y del Tree surgeon  Use un sostn que le brinde buen soporte si sus mamas estn sensibles.  Haga pausas frecuentes y descanse con las piernas levantadas si tiene calambres en las piernas o dolor en la zona lumbar.  Dese baos de asiento con agua tibia para Best boy o las molestias causadas  por las hemorroides. Use una crema para las hemorroides si el mdico la autoriza.  Si desarrolla venas hinchadas y abultadas (vrices) en las piernas: ? Use medias de compresin o medias de descanso como se lo haya indicado el mdico. ? Levante (eleve) los pies durante 60mnutos, 3 o 4veces por dTraining and development officer ? Limite el consumo de sal en sus alimentos. Seguridad  CMetLifecinturn de seguridad cuando conduzca.  Haga una lista de los nmeros de telfono de eFreight forwarder que iBJ'snmeros de telfono de familiares, amigos, eSolenhospital, as como los departamentos de polica y bomberos. Preparacin para la llegada del beb Para prepararse para la llegada de su beb:  Tome clases prenatales.  Practique ir mGuardian Life Insuranceal hospital.  VLakeland Surgical And Diagnostic Center LLP Florida Campusy recorra el rea de maternidad.  Hable en su trabajo acerca de tomar licencia cuando llegue el beb.  Prepare el bolso que llevar al hospital.  Prepare la habitacin del beb.  Concurra a los controles mdicos.  Compre un asiento de seguridad oAutoNationatrs para llevar al beb en el automvil. Aprenda cmo instalarlo en el auto. Instrucciones generales  No se d baos de inmersin en agua caliente, baos turcos ni saunas.  No consuma ningn producto que contenga nicotina o tabaco, como cigarrillos y cPsychologist, sport and exercise Si necesita ayuda para dejar de fumar, consulte al mdico.  No beba alcohol.  No se haga duchas vaginales ni use tampones o toallas higinicas perfumadas.  No mantenga las piernas cruzadas durante mucho tiempo.  No haga viajes de larga distancia, excepto si es obligatorio. Hgalos solamente si su mdico la autoriza.  Visite a su dentista si no lo ha hQuarry manager Use un cepillo de cerdas suaves para cepillarse los dientes. Psese el hilo dental con suavidad.  Evite el contacto con las bandejas sanitarias de los gatos y la tierra que estos animales usan. Estos elementos contienen bacterias  que pueden causar defectos congnitos al beb y la posible prdida del beb (aborto espontneo) o la muerte fetal.  Concurra a todas las visitas prenatales como se lo haya indicado el mdico. Esto es importante. Comunquese con un mdico si:  No est segura de si est en trabajo de parto o si ha roto la bolsa de las aguas.  Tiene mareos.  Tiene clicos leves o siente presin en la parte baja del vientre.  Sufre un dolor persistente en el abdomen.  Sigue teniendo mGuardian Life Insurance vomita o tiene heces lquidas.  Advierte un lquido con olor ftido que proviene de la vagina.  Siente dolor al oContinental Airlines Solicite ayuda de inmediato si:  Tiene fiebre.  Tiene una prdida de lquido por la vagina.  Tiene sangrado o pequeas prdidas vaginales.  Siente dolor intenso o clicos en el abdomen.  Aumenta o baja de peso rpidamente.  Tiene dificultades para recuperar el aliento y siente dolor en el pecho.  Sbitamente se le hinchan mucho el rostro, las [place], los tobillos, los pies o las piernas.  No ha sentido los movimientos del beb durante Leone Brand.  Siente un dolor de cabeza intenso que no se alivia con medicamentos.  Tiene dificultad para ver.  Tiene prdida de lquido o Chief Financial Officer chorro de lquido de la vagina antes de estar en la semana 37.  Tiene espasmos abdominales (contracciones) regulares antes de estar en la semana 73. Resumen  El tercer trimestre comprende desde la [employer] Machines la [id number] (desde el mes7 hasta el mes9). Esta es la poca en que el beb en gestacin crece muy rpidamente.  Siga los consejos del mdico con respecto a los medicamentos, la alimentacin y [place].  Preprese para la llegada del beb tomando las clases prenatales, preparando todo lo que necesitar el beb, arreglando la habitacin del beb y concurriendo a los controles mdicos.  Solicite ayuda de inmediato si tiene sangrado por la vagina, siente dolor en el pecho o tiene  dificultad para respirar, o si no ha sentido que su beb se mueve en el transcurso de ms de AES Corporation. Esta informacin no tiene Marine scientist el consejo del mdico. Asegrese de hacerle al mdico cualquier pregunta que tenga. Document Revised: 10/29/2016 Document Reviewed: 10/29/2016 Elsevier Patient Education  [place] (Colposcopy) La colposcopia es un procedimiento para examinar la parte inferior del tero (cuello del tero), la vagina, la zona que rodea la abertura vaginal (vulva) para identificar anormalidades o signos de enfermedad. Para realizar este procedimiento se utiliza un microscopio con luz o una lupa (colposcopio). Si se encuentran clulas inusuales durante el procedimiento, el mdico puede extraer Truddie Coco de tejido para examinarla (biopsia). Se puede realizar una colposcopia si:  Tiene un Papanicolaou anormal. Un Papanicolaou es una prueba de deteccin que se utiliza para Hydrographic surveyor signos de cncer o infeccin en la vagina, el cuello del tero y Nurse, learning disability.  Tiene un Papanicolaou con resultado positivo de VPH de alto riesgo (virus del papiloma humano).  Si tiene [place] llaga o una lesin en el cuello del tero.  Tiene verrugas genitales en la vulva, la vagina o el cuello del tero.  Tom ciertos medicamentos durante el embarazo, como dietiletilbestrol (DES).  Siente dolor durante las [employer].  Tiene hemorragias vaginales, especialmente despus de Retail banker.  Tienen que extraerle un plipo del cuello del tero.  Tienen que encontrarle un hilo perdido del dispositivo intrauterino (DIU). INFORME A SU MDICO:  Cualquier alergia que tenga, incluidas las alergias a medicamentos recetados, al ltex o al yodo.  Todos los Lyondell Chemical, incluidos vitaminas, hierbas, gotas oftlmicas, cremas y medicamentos de venta libre. Lleve una lista de todos sus medicamentos a la [place].  Cualquier problema previo que  usted o los miembros de su familia hayan tenido con anestsicos.  Enfermedades de la sangre que tenga.  Cirugas previas.  Cualquier afeccin mdica que tenga, como enfermedad plvica inflamatoria (EPI) o trastorno endometrial.  Algn antecedente de desmayos frecuentes.  Su ciclo menstrual y el mtodo de control de la natalidad (anticonceptivo) que Lam[place].  Su historia clnica, incluido cualquier tratamiento previo en el cuello del tero.  Si est embarazada o podra estarlo.  RIESGOS Y COMPLICACIONES En general, se trata de un procedimiento seguro. Sin embargo, pueden ocurrir complicaciones, por ejemplo:  Engineer, mining.  Infeccin, que puede incluir fiebre, secrecin con mal olor o dolor plvico.  Hemorragia o secrecin.  Diagnstico errneo.  Desmayos y Architectural technologist, pero esto es poco frecuente.  Reacciones alrgicas a los medicamentos.  Daos a Systems developer u otros rganos. ANTES DEL PROCEDIMIENTO  Si tiene el perodo menstrual o lo tendr en el momento del procedimiento, dgaselo al mdico. En general, la colposcopia no se realiza Environmental consultant.  Contine con sus prcticas anticonceptivas antes y despus del procedimiento.  Durante las 24horas previas a la colposcopia: ? No se haga duchas vaginales. ? No use tampones. ? No se aplique medicamentos, cremas o supositorios en la vagina. ? No tenga relaciones sexuales.  Consulte al mdico si debe hacer o no lo siguiente: ? Cambiar o suspender los medicamentos que toma habitualmente. Esto es muy importante si toma medicamentos para la diabetes o anticoagulantes. ? Tomar medicamentos como aspirina e ibuprofeno. Estos medicamentos pueden tener un efecto anticoagulante en la [place]. No tome estos medicamentos antes del procedimiento si el mdico le indica que no lo haga. Es probable que Public affairs consultant diga que evite tomar aspirina o medicamentos que contienen aspirina durante 7das antes del  procedimiento.  Siga las indicaciones del mdico respecto de las restricciones para las comidas o las bebidas. Probablemente deba seguir una dieta normal el da del procedimiento y no omitir Patent examiner.  Pueden hacerle un examen o estudios. Se realizar una prueba de Charter Communications del procedimiento.  Pueden extraerle Lauris Poag de [place] o pedirle [place] de [place].  Haga planes para que una persona la lleve a su casa despus del hospital o la clnica.  Si se ir a su casa inmediatamente despus del procedimiento, planifique que alguien se quede con usted durante 24horas. PROCEDIMIENTO  Estar acostada boca arriba, con los pies en los soportes (estribos).  Se introducir en la vagina un instrumento tibio y lubricado (espculo). El Reliant Energy se usar para separar las paredes de la vagina de modo que el mdico pueda ver el cuello del tero y el interior de la vagina.  Se utilizar un hisopo para Scientific laboratory technician una pequea cantidad de solucin lquida en las zonas que se examinarn. Esta solucin facilita la observacin de clulas anormales. Puede sentir un leve ardor en este momento.  Se usar el colposcopio para observar el cuello del tero con una luz blanca brillante. Se sostendr el colposcopio cerca de la vulva, y Electronics engineer la vulva, la vagina y el cuello del tero para facilitar la observacin.  El mdico puede decidir si realiza una biopsia. En este caso: ? Pueden administrarle un medicamento para adormecer la zona (anestesia local). ? Se emplearn instrumentos quirrgicos para succionar mucosidad y clulas a travs de la vagina. ? Puede sentir un dolor leve cuando le extraen la [place] de tejido. ? Puede haber hemorragia. Se puede usar una solucin para Comptroller. ? Si se necesita Lauris Poag de tejido del interior del cuello del tero, se puede realizar otro procedimiento, llamado curetaje endocervical (CEC). Durante este procedimiento, se usar un instrumento  curvo (cureta) para raspar clulas del cuello del tero o de la parte superior del tero (endocervix).  El Engineer, civil (consulting) la ubicacin de cualquier anormalidad. Este procedimiento puede variar segn el mdico y el hospital. DESPUS DEL PROCEDIMIENTO  Estar recostada y har reposo durante unos minutos. Tal vez le ofrezcan  jugo o galletas.  Le controlarn la presin arterial, la frecuencia cardaca, la frecuencia respiratoria y Retail buyer de oxgeno en la sangre hasta que haya desaparecido el efecto de los medicamentos administrados.  Quizs [employer] Inc de compresin. Estas medias ayudan a Mining engineer formacin de cogulos sanguneos y a Forensic psychologist de las piernas.  Puede tener calambres en el abdomen. Esto debe desaparecer despus de algunos minutos. Esta informacin no tiene Marine scientist el consejo del mdico. Asegrese de hacerle al mdico cualquier pregunta que tenga. Document Revised: 02/14/2016 Document Reviewed: 10/31/2015 Elsevier Patient Education  [place].

## 2019-11-24 NOTE — Progress Notes (Signed)
   PRENATAL VISIT NOTE  Subjective:  Debbie Strickland Signs is a 39 y.o. G3P2002 at [redacted]w[redacted]d being seen today for ongoing prenatal care.  She is currently monitored for the following issues for this high-risk pregnancy and has Supervision of high risk pregnancy, antepartum; AMA (advanced maternal age) multigravida 35+; Obesity in pregnancy; HGSIL (high grade squamous intraepithelial lesion) on Pap smear of cervix; BMI 40.0-44.9, adult (HCC); Language barrier; and Gestational diabetes mellitus in childbirth, diet controlled on their problem list.  Patient doing well with no acute concerns today. She reports no complaints.  Contractions: Not present.  .  Movement: Present. Denies leaking of fluid. She notes compliance with her GDM diet and taking her blood sugars.  The following portions of the patient's history were reviewed and updated as appropriate: allergies, current medications, past family history, past medical history, past social history, past surgical history and problem list. Problem list updated.  Objective:   Vitals:   11/24/19 1458  BP: 134/69  Pulse: 71  Weight: 207 lb 12.8 oz (94.3 kg)    Fetal Status: Fetal Heart Rate (bpm): 158   Movement: Present     General:  Alert, oriented and cooperative. Patient is in no acute distress.  Skin: Skin is warm and dry. No rash noted.   Cardiovascular: Normal heart rate noted  Respiratory: Normal respiratory effort, no problems with respiration noted  Abdomen: Soft, gravid, appropriate for gestational age.  Pain/Pressure: Present     Pelvic: Cervical exam deferred        Extremities: Normal range of motion.  Edema: Trace  Mental Status:  Normal mood and affect. Normal behavior. Normal judgment and thought content.   Assessment and Plan:  Pregnancy: G3P2002 at [redacted]w[redacted]d  1. Gestational diabetes mellitus in pregnancy, diet controlled Excellent blood sugars noted, no values out of range Pt has growth scan scheduled for 12/04/2019  2.  Supervision of high risk pregnancy, antepartum F/u in 1 week with 36 week labs  3. Multigravida of advanced maternal age in third trimester   4. Language barrier Interpreter present  5. Obesity in pregnancy   6. BMI 40.0-44.9, adult (HCC)   7. HGSIL (high grade squamous intraepithelial lesion) on Pap smear of cervix colpo after postpartum visit  Preterm labor symptoms and general obstetric precautions including but not limited to vaginal bleeding, contractions, leaking of fluid and fetal movement were reviewed in detail with the patient.  Please refer to After Visit Summary for other counseling recommendations.   Return in about 1 week (around 12/01/2019) for [facility], in person.   Mariel Aloe, MD

## 2019-12-01 ENCOUNTER — Other Ambulatory Visit: Payer: Self-pay

## 2019-12-01 ENCOUNTER — Other Ambulatory Visit: Payer: Self-pay | Admitting: *Deleted

## 2019-12-01 DIAGNOSIS — O24419 Gestational diabetes mellitus in pregnancy, unspecified control: Secondary | ICD-10-CM

## 2019-12-04 ENCOUNTER — Ambulatory Visit (INDEPENDENT_AMBULATORY_CARE_PROVIDER_SITE_OTHER): Payer: Self-pay | Admitting: Obstetrics and Gynecology

## 2019-12-04 ENCOUNTER — Other Ambulatory Visit (HOSPITAL_COMMUNITY)
Admission: RE | Admit: 2019-12-04 | Discharge: 2019-12-04 | Disposition: A | Discharge status: Home / Self Care | Payer: No Typology Code available for payment source | Source: Ambulatory Visit | Attending: Obstetrics and Gynecology | Admitting: Obstetrics and Gynecology

## 2019-12-04 ENCOUNTER — Other Ambulatory Visit: Payer: Self-pay

## 2019-12-04 ENCOUNTER — Encounter: Payer: Self-pay | Admitting: Obstetrics and Gynecology

## 2019-12-04 ENCOUNTER — Encounter: Payer: Self-pay | Admitting: *Deleted

## 2019-12-04 ENCOUNTER — Ambulatory Visit: Payer: No Typology Code available for payment source | Admitting: *Deleted

## 2019-12-04 ENCOUNTER — Ambulatory Visit: Payer: No Typology Code available for payment source | Attending: Obstetrics

## 2019-12-04 VITALS — BP 132/78 | HR 85 | Wt 210.5 lb

## 2019-12-04 DIAGNOSIS — Z3A36 36 weeks gestation of pregnancy: Secondary | ICD-10-CM

## 2019-12-04 DIAGNOSIS — O0990 Supervision of high risk pregnancy, unspecified, unspecified trimester: Secondary | ICD-10-CM

## 2019-12-04 DIAGNOSIS — E669 Obesity, unspecified: Secondary | ICD-10-CM

## 2019-12-04 DIAGNOSIS — Z362 Encounter for other antenatal screening follow-up: Secondary | ICD-10-CM

## 2019-12-04 DIAGNOSIS — O99210 Obesity complicating pregnancy, unspecified trimester: Secondary | ICD-10-CM

## 2019-12-04 DIAGNOSIS — O09523 Supervision of elderly multigravida, third trimester: Secondary | ICD-10-CM

## 2019-12-04 DIAGNOSIS — O24410 Gestational diabetes mellitus in pregnancy, diet controlled: Secondary | ICD-10-CM | POA: Insufficient documentation

## 2019-12-04 DIAGNOSIS — O24420 Gestational diabetes mellitus in childbirth, diet controlled: Secondary | ICD-10-CM

## 2019-12-04 DIAGNOSIS — O99213 Obesity complicating pregnancy, third trimester: Secondary | ICD-10-CM

## 2019-12-04 DIAGNOSIS — Z789 Other specified health status: Secondary | ICD-10-CM

## 2019-12-04 LAB — POCT URINALYSIS DIP (DEVICE)
Bilirubin Urine: NEGATIVE
Glucose, UA: NEGATIVE mg/dL
Ketones, ur: NEGATIVE mg/dL
Leukocytes,Ua: NEGATIVE
Nitrite: NEGATIVE
Protein, ur: 100 mg/dL — AB
Specific Gravity, Urine: 1.025 (ref 1.005–1.030)
Urobilinogen, UA: 0.2 mg/dL (ref 0.0–1.0)
pH: 6.5 (ref 5.0–8.0)

## 2019-12-04 NOTE — Progress Notes (Signed)
   PRENATAL VISIT NOTE  Subjective:  Debbie Strickland is a 39 y.o. G3P2002 at [redacted]w[redacted]d being seen today for ongoing prenatal care.  She is currently monitored for the following issues for this high-risk pregnancy and has Supervision of high risk pregnancy, antepartum; AMA (advanced maternal age) multigravida 35+; Obesity in pregnancy; HGSIL (high grade squamous intraepithelial lesion) on Pap smear of cervix; BMI 40.0-44.9, adult (HCC); Language barrier; Gestational diabetes mellitus in childbirth, diet controlled; and [redacted] weeks gestation of pregnancy on their problem list.  Patient reports no complaints.  Contractions: Irritability. Vag. Bleeding: None.  Movement: Present. Denies leaking of fluid.   The following portions of the patient's history were reviewed and updated as appropriate: allergies, current medications, past family history, past medical history, past social history, past surgical history and problem list.   Objective:   Vitals:   12/04/19 1006  BP: 132/78  Pulse: 85  Weight: 210 lb 8 oz (95.5 kg)    Fetal Status: Fetal Heart Rate (bpm): 140 Fundal Height: 37 cm Movement: Present     General:  Alert, oriented and cooperative. Patient is in no acute distress.  Skin: Skin is warm and dry. No rash noted.   Cardiovascular: Normal heart rate noted  Respiratory: Normal respiratory effort, no problems with respiration noted  Abdomen: Soft, gravid, appropriate for gestational age.  Pain/Pressure: Present     Pelvic: Cervical exam performed in the presence of a chaperone Dilation: Closed Effacement (%): Thick Station: Ballotable  Extremities: Normal range of motion.  Edema: Trace  Mental Status: Normal mood and affect. Normal behavior. Normal judgment and thought content.   Assessment and Plan:  Pregnancy: G3P2002 at [redacted]w[redacted]d 1. Supervision of high risk pregnancy, antepartum Patient is doing well without complaints Cultures today - Culture, beta strep (group b only) -  GC/Chlamydia probe amp ([facility])not at [facility]  2. Gestational diabetes mellitus in childbirth, diet controlled CBGs reviewed and all within range.  Continue diet control Follow up growth scan today  3. Multigravida of advanced maternal age in third trimester   4. Obesity in pregnancy Continue ASA  5. Language barrier Spanish interpreter present during the encounter  Preterm labor symptoms and general obstetric precautions including but not limited to vaginal bleeding, contractions, leaking of fluid and fetal movement were reviewed in detail with the patient. Please refer to After Visit Summary for other counseling recommendations.   Return in about 1 week (around 12/11/2019) for in person, ROB, High risk.  Future Appointments  Date Time Provider Department Center  12/04/2019  1:00 PM [facility] NURSE [facility]  12/04/2019  1:15 PM [facility]    Catalina Antigua, MD

## 2019-12-07 LAB — GC/CHLAMYDIA PROBE AMP (~~LOC~~) NOT AT ARMC
Chlamydia: NEGATIVE
Comment: NEGATIVE
Comment: NORMAL
Neisseria Gonorrhea: NEGATIVE

## 2019-12-08 LAB — CULTURE, BETA STREP (GROUP B ONLY): Strep Gp B Culture: NEGATIVE

## 2019-12-11 ENCOUNTER — Other Ambulatory Visit: Payer: Self-pay

## 2019-12-11 ENCOUNTER — Ambulatory Visit (INDEPENDENT_AMBULATORY_CARE_PROVIDER_SITE_OTHER): Payer: No Typology Code available for payment source | Admitting: Family Medicine

## 2019-12-11 VITALS — BP 136/75 | HR 66 | Wt 211.0 lb

## 2019-12-11 DIAGNOSIS — Z3A37 37 weeks gestation of pregnancy: Secondary | ICD-10-CM

## 2019-12-11 DIAGNOSIS — O09523 Supervision of elderly multigravida, third trimester: Secondary | ICD-10-CM

## 2019-12-11 DIAGNOSIS — R87613 High grade squamous intraepithelial lesion on cytologic smear of cervix (HGSIL): Secondary | ICD-10-CM

## 2019-12-11 DIAGNOSIS — O0990 Supervision of high risk pregnancy, unspecified, unspecified trimester: Secondary | ICD-10-CM

## 2019-12-11 DIAGNOSIS — O99210 Obesity complicating pregnancy, unspecified trimester: Secondary | ICD-10-CM

## 2019-12-11 DIAGNOSIS — Z789 Other specified health status: Secondary | ICD-10-CM

## 2019-12-11 DIAGNOSIS — O24420 Gestational diabetes mellitus in childbirth, diet controlled: Secondary | ICD-10-CM

## 2019-12-11 NOTE — Progress Notes (Signed)
Subjective:  Debbie Strickland is a 39 y.o. G3P2002 at [redacted]w[redacted]d being seen today for ongoing prenatal care.  She is currently monitored for the following issues for this high-risk pregnancy and has Supervision of high risk pregnancy, antepartum; AMA (advanced maternal age) multigravida 35+; Obesity in pregnancy; HGSIL (high grade squamous intraepithelial lesion) on Pap smear of cervix; BMI 40.0-44.9, adult (HCC); Language barrier; Gestational diabetes mellitus in childbirth, diet controlled; and [redacted] weeks gestation of pregnancy on their problem list.  GDM: Patient diet controlled.  Reports no hypoglycemic episodes.  Tolerating medication well Fasting: controlled 2hr PP: controlled  Patient reports no complaints.  Contractions: Irritability. Vag. Bleeding: None.  Movement: Present. Denies leaking of fluid.   The following portions of the patient's history were reviewed and updated as appropriate: allergies, current medications, past family history, past medical history, past social history, past surgical history and problem list. Problem list updated.  Objective:   Vitals:   12/11/19 0816  BP: 136/75  Pulse: 66  Weight: 211 lb (95.7 kg)    Fetal Status: Fetal Heart Rate (bpm): 121   Movement: Present     General:  Alert, oriented and cooperative. Patient is in no acute distress.  Skin: Skin is warm and dry. No rash noted.   Cardiovascular: Normal heart rate noted  Respiratory: Normal respiratory effort, no problems with respiration noted  Abdomen: Soft, gravid, appropriate for gestational age. Pain/Pressure: Present     Pelvic: Vag. Bleeding: None     Cervical exam deferred        Extremities: Normal range of motion.  Edema: Trace  Mental Status: Normal mood and affect. Normal behavior. Normal judgment and thought content.   Urinalysis:      Assessment and Plan:  Pregnancy: G3P2002 at [redacted]w[redacted]d  1. Supervision of high risk pregnancy, antepartum 2. [redacted] weeks gestation of  pregnancy FHT and FH normal  3. Gestational diabetes mellitus in childbirth, diet controlled Controlled with diet Induce at 40 weeks  4. Multigravida of advanced maternal age in third trimester  5. Language barrier Interpreter present  6. Obesity in pregnancy  7. HGSIL (high grade squamous intraepithelial lesion) on Pap smear of cervix Repeat 8 weeks postpartum  Term labor symptoms and general obstetric precautions including but not limited to vaginal bleeding, contractions, leaking of fluid and fetal movement were reviewed in detail with the patient. Please refer to After Visit Summary for other counseling recommendations.  Return in about 1 week (around 12/18/2019) for In Office, HR OB f/u.   Levie Heritage, DO

## 2019-12-11 NOTE — Progress Notes (Signed)
Spanish interpreter Raquel M. from language resources.

## 2019-12-21 ENCOUNTER — Inpatient Hospital Stay (HOSPITAL_COMMUNITY)
Admission: AD | Admit: 2019-12-21 | Discharge: 2019-12-25 | DRG: 807 | Disposition: A | Discharge status: Home / Self Care | Payer: Medicaid Other | Attending: Obstetrics and Gynecology | Admitting: Obstetrics and Gynecology

## 2019-12-21 ENCOUNTER — Encounter (HOSPITAL_COMMUNITY): Payer: Self-pay | Admitting: Obstetrics and Gynecology

## 2019-12-21 ENCOUNTER — Ambulatory Visit (INDEPENDENT_AMBULATORY_CARE_PROVIDER_SITE_OTHER): Payer: No Typology Code available for payment source | Admitting: Obstetrics & Gynecology

## 2019-12-21 ENCOUNTER — Other Ambulatory Visit: Payer: Self-pay

## 2019-12-21 VITALS — BP 146/87 | HR 62 | Wt 218.0 lb

## 2019-12-21 DIAGNOSIS — O134 Gestational [pregnancy-induced] hypertension without significant proteinuria, complicating childbirth: Secondary | ICD-10-CM | POA: Diagnosis present

## 2019-12-21 DIAGNOSIS — O133 Gestational [pregnancy-induced] hypertension without significant proteinuria, third trimester: Secondary | ICD-10-CM | POA: Diagnosis not present

## 2019-12-21 DIAGNOSIS — O99214 Obesity complicating childbirth: Secondary | ICD-10-CM | POA: Diagnosis present

## 2019-12-21 DIAGNOSIS — O09529 Supervision of elderly multigravida, unspecified trimester: Secondary | ICD-10-CM

## 2019-12-21 DIAGNOSIS — Z8632 Personal history of gestational diabetes: Secondary | ICD-10-CM | POA: Diagnosis present

## 2019-12-21 DIAGNOSIS — O1414 Severe pre-eclampsia complicating childbirth: Principal | ICD-10-CM | POA: Diagnosis present

## 2019-12-21 DIAGNOSIS — Z20822 Contact with and (suspected) exposure to covid-19: Secondary | ICD-10-CM | POA: Diagnosis present

## 2019-12-21 DIAGNOSIS — Z3A39 39 weeks gestation of pregnancy: Secondary | ICD-10-CM

## 2019-12-21 DIAGNOSIS — O0990 Supervision of high risk pregnancy, unspecified, unspecified trimester: Secondary | ICD-10-CM

## 2019-12-21 DIAGNOSIS — O99215 Obesity complicating the puerperium: Secondary | ICD-10-CM | POA: Diagnosis not present

## 2019-12-21 DIAGNOSIS — O09523 Supervision of elderly multigravida, third trimester: Secondary | ICD-10-CM

## 2019-12-21 DIAGNOSIS — O24430 Gestational diabetes mellitus in the puerperium, diet controlled: Secondary | ICD-10-CM | POA: Diagnosis not present

## 2019-12-21 DIAGNOSIS — E669 Obesity, unspecified: Secondary | ICD-10-CM | POA: Diagnosis present

## 2019-12-21 DIAGNOSIS — Z603 Acculturation difficulty: Secondary | ICD-10-CM | POA: Diagnosis present

## 2019-12-21 DIAGNOSIS — N871 Moderate cervical dysplasia: Secondary | ICD-10-CM | POA: Diagnosis present

## 2019-12-21 DIAGNOSIS — R87613 High grade squamous intraepithelial lesion on cytologic smear of cervix (HGSIL): Secondary | ICD-10-CM | POA: Diagnosis not present

## 2019-12-21 DIAGNOSIS — O99210 Obesity complicating pregnancy, unspecified trimester: Secondary | ICD-10-CM | POA: Diagnosis present

## 2019-12-21 DIAGNOSIS — Z789 Other specified health status: Secondary | ICD-10-CM | POA: Diagnosis present

## 2019-12-21 DIAGNOSIS — O24410 Gestational diabetes mellitus in pregnancy, diet controlled: Secondary | ICD-10-CM | POA: Diagnosis not present

## 2019-12-21 DIAGNOSIS — O24420 Gestational diabetes mellitus in childbirth, diet controlled: Secondary | ICD-10-CM

## 2019-12-21 DIAGNOSIS — Z8759 Personal history of other complications of pregnancy, childbirth and the puerperium: Secondary | ICD-10-CM | POA: Diagnosis present

## 2019-12-21 DIAGNOSIS — O99893 Other specified diseases and conditions complicating puerperium: Secondary | ICD-10-CM | POA: Diagnosis not present

## 2019-12-21 HISTORY — DX: Gestational diabetes mellitus in pregnancy, unspecified control: O24.419

## 2019-12-21 HISTORY — DX: Gestational (pregnancy-induced) hypertension without significant proteinuria, unspecified trimester: O13.9

## 2019-12-21 LAB — PROTEIN / CREATININE RATIO, URINE
Creatinine, Urine: 62.13 mg/dL
Protein Creatinine Ratio: 1.59 mg/mg{Cre} — ABNORMAL HIGH (ref 0.00–0.15)
Total Protein, Urine: 99 mg/dL

## 2019-12-21 LAB — COMPREHENSIVE METABOLIC PANEL
ALT: 11 U/L (ref 0–44)
AST: 19 U/L (ref 15–41)
Albumin: 2.5 g/dL — ABNORMAL LOW (ref 3.5–5.0)
Alkaline Phosphatase: 96 U/L (ref 38–126)
Anion gap: 8 (ref 5–15)
BUN: 7 mg/dL (ref 6–20)
CO2: 20 mmol/L — ABNORMAL LOW (ref 22–32)
Calcium: 8.3 mg/dL — ABNORMAL LOW (ref 8.9–10.3)
Chloride: 108 mmol/L (ref 98–111)
Creatinine, Ser: 0.61 mg/dL (ref 0.44–1.00)
GFR calc Af Amer: >60 mL/min (ref >60)
GFR calc non Af Amer: >60 mL/min (ref >60)
Glucose, Bld: 80 mg/dL (ref 70–99)
Potassium: 4 mmol/L (ref 3.5–5.1)
Sodium: 136 mmol/L (ref 135–145)
Total Bilirubin: 0.2 mg/dL — ABNORMAL LOW (ref 0.3–1.2)
Total Protein: 5.9 g/dL — ABNORMAL LOW (ref 6.5–8.1)

## 2019-12-21 LAB — CBC
HCT: 36.8 % (ref 36.0–46.0)
Hemoglobin: 11.8 g/dL — ABNORMAL LOW (ref 12.0–15.0)
MCH: 26.4 pg (ref 26.0–34.0)
MCHC: 32.1 g/dL (ref 30.0–36.0)
MCV: 82.3 fL (ref 80.0–100.0)
Platelets: 228 10*3/uL (ref 150–400)
RBC: 4.47 MIL/uL (ref 3.87–5.11)
RDW: 14.8 % (ref 11.5–15.5)
WBC: 10.8 10*3/uL — ABNORMAL HIGH (ref 4.0–10.5)
nRBC: 0 % (ref 0.0–0.2)

## 2019-12-21 LAB — TYPE AND SCREEN
ABO/RH(D): O POS
Antibody Screen: NEGATIVE

## 2019-12-21 LAB — GLUCOSE, CAPILLARY
Glucose-Capillary: 69 mg/dL — ABNORMAL LOW (ref 70–99)
Glucose-Capillary: 66 mg/dL — ABNORMAL LOW (ref 70–99)

## 2019-12-21 LAB — SARS CORONAVIRUS 2 BY RT PCR (HOSPITAL ORDER, PERFORMED IN ~~LOC~~ HOSPITAL LAB): SARS Coronavirus 2: NEGATIVE

## 2019-12-21 MED ORDER — OXYTOCIN BOLUS FROM INFUSION
333.0000 mL | Freq: Once | INTRAVENOUS | Status: AC
  Administered 2019-12-23: 333 mL via INTRAVENOUS

## 2019-12-21 MED ORDER — OXYCODONE-ACETAMINOPHEN 5-325 MG PO TABS: 1.0000 | ORAL_TABLET | ORAL | Status: DC | PRN

## 2019-12-21 MED ORDER — ACETAMINOPHEN 325 MG PO TABS: 650.0000 mg | ORAL_TABLET | ORAL | Status: DC | PRN

## 2019-12-21 MED ORDER — OXYCODONE-ACETAMINOPHEN 5-325 MG PO TABS: 2.0000 | ORAL_TABLET | ORAL | Status: DC | PRN

## 2019-12-21 MED ORDER — LACTATED RINGERS IV SOLN: INTRAVENOUS | Status: DC

## 2019-12-21 MED ORDER — OXYTOCIN-SODIUM CHLORIDE 30-0.9 UT/500ML-% IV SOLN: 1.0000 m[IU]/min | INTRAVENOUS | Status: DC

## 2019-12-21 MED ORDER — SOD CITRATE-CITRIC ACID 500-334 MG/5ML PO SOLN: 30.0000 mL | ORAL | Status: DC | PRN

## 2019-12-21 MED ORDER — FENTANYL CITRATE (PF) 100 MCG/2ML IJ SOLN
100.0000 ug | INTRAMUSCULAR | Status: DC | PRN
  Administered 2019-12-23: 100 ug via INTRAVENOUS
  Filled 2019-12-21: qty 2

## 2019-12-21 MED ORDER — MISOPROSTOL 50MCG HALF TABLET
50.0000 ug | ORAL_TABLET | ORAL | Status: DC | PRN
  Administered 2019-12-21 – 2019-12-22 (×4): 50 ug via BUCCAL
  Filled 2019-12-21 (×4): qty 1

## 2019-12-21 MED ORDER — ONDANSETRON HCL 4 MG/2ML IJ SOLN
4.0000 mg | Freq: Four times a day (QID) | INTRAMUSCULAR | Status: DC | PRN
  Administered 2019-12-23: 4 mg via INTRAVENOUS
  Filled 2019-12-21: qty 2

## 2019-12-21 MED ORDER — LIDOCAINE HCL (PF) 1 % IJ SOLN: 30.0000 mL | INTRAMUSCULAR | Status: DC | PRN

## 2019-12-21 MED ORDER — TERBUTALINE SULFATE 1 MG/ML IJ SOLN: 0.2500 mg | Freq: Once | INTRAMUSCULAR | Status: DC | PRN

## 2019-12-21 MED ORDER — LACTATED RINGERS IV SOLN: 500.0000 mL | INTRAVENOUS | Status: DC | PRN

## 2019-12-21 MED ORDER — OXYTOCIN-SODIUM CHLORIDE 30-0.9 UT/500ML-% IV SOLN
2.5000 [IU]/h | INTRAVENOUS | Status: DC
  Administered 2019-12-23: 2.5 [IU]/h via INTRAVENOUS
  Filled 2019-12-21: qty 500

## 2019-12-21 NOTE — Progress Notes (Signed)
Labor Progress Note  Ipad interpreter offered for encounter, patient and family decline and would like to interpret for patient.  Debbie Strickland is a 39 y.o. G3P2002 at [redacted]w[redacted]d presented for IOL gHTN and A1GDM.  S: Doing well without complaints. Denies headaches, vision changes, cp, sob, LE edema.   O:  BP (!) 157/79   Pulse 65   Temp 99 F (37.2 C) (Oral)   Resp 18   Ht 4\' 11"  (1.499 m)   Wt 98.9 kg   LMP  (LMP Unknown)   BMI 44.03 kg/m  EFM: baseline 150bpm/mod variability/+ accels/no decels Toco: irritable  CVE: Dilation: Closed Effacement (%): Thick Cervical Position: Posterior Station: -3 Exam by:: Dr. [email or web address]   A&P: 39 y.o. 24 [redacted]w[redacted]d presented for IOL gHTN and A1GDM.  #IOL: S/p cytotec x1. Cervical unchanged from arrival. Will re-dose cytotec and consider FB placement at next exam if appropriate. #Pain: PRN, desires CSE #FWB: cat 1 #GBS negative #gHTN: Diagnosed today in clinic. Has had 2 severe range BP since admission. PreE labs unremarkable. Asymptomatic. Will continue to monitor.  #A1GDM: q4 BGL checks during latent labor, increase to q4 during active labor.  [redacted]w[redacted]d, MD 9:05 PM

## 2019-12-21 NOTE — Progress Notes (Signed)
   PRENATAL VISIT NOTE  Subjective:  Debbie Strickland is a 39 y.o. G3P2002 at [redacted]w[redacted]d being seen today for ongoing prenatal care. Patient is Spanish-speaking only, interpreter present for this encounter.  She is currently monitored for the following issues for this high-risk pregnancy and has Supervision of high risk pregnancy, antepartum; AMA (advanced maternal age) multigravida 35+; Obesity in pregnancy; HGSIL (high grade squamous intraepithelial lesion) on Pap smear of cervix; BMI 40.0-44.9, adult (HCC); Language barrier; Gestational diabetes mellitus in childbirth, diet controlled; and [redacted] weeks gestation of pregnancy on their problem list.  Patient reports no complaints.  Patient denies any headaches, visual symptoms, RUQ/epigastric pain or other concerning symptoms.  Contractions: Irritability. Vag. Bleeding: None.  Movement: Present. Denies leaking of fluid.   The following portions of the patient's history were reviewed and updated as appropriate: allergies, current medications, past family history, past medical history, past social history, past surgical history and problem list.   Objective:   Vitals:   12/21/19 0914 12/21/19 0957  BP: (!) 156/84 (!) 146/87  Pulse: 70 62  Weight: 218 lb (98.9 kg)   Had BP 140/81 at [redacted]w[redacted]d  Fetal Status: Fetal Heart Rate (bpm): 135   Movement: Present     General:  Alert, oriented and cooperative. Patient is in no acute distress.  Skin: Skin is warm and dry. No rash noted.   Cardiovascular: Normal heart rate noted  Respiratory: Normal respiratory effort, no problems with respiration noted  Abdomen: Soft, gravid, appropriate for gestational age.  Pain/Pressure: Present     Pelvic: Cervical exam deferred        Extremities: Normal range of motion.  Edema: Trace  Mental Status: Normal mood and affect. Normal behavior. Normal judgment and thought content.   Assessment and Plan:  Pregnancy: G3P2002 at 107w0d 1. Gestational hypertension, third  trimester Meets criteria for at least [facility], delivery indicated.  Patient informed. She has to go home to organize childcare and then go to [facility]. NST performed today was reviewed and was found to be reactive.  [facility] providers and staff notified of direct admission.   2. Gestational diabetes mellitus in childbirth, diet controlled 3. [redacted] weeks gestation of pregnancy 4. Supervision of high risk pregnancy, antepartum To [facility] for delivery.  Please refer to After Visit Summary for other counseling recommendations.   No follow-ups on file.  No future appointments.  Jaynie Collins, MD

## 2019-12-21 NOTE — H&P (Signed)
Debbie Strickland is a 38 y.o. G3P2002 at [redacted]w[redacted]d female presenting for IOL due to gHTN (she had elevated pressures in the office today but not severe range and no severe features) and A1GDM. OB History    Gravida  3   Para  2   Term  2   Preterm      AB      Living  2     SAB      TAB      Ectopic      Multiple      Live Births  2          Past Medical History:  Diagnosis Date  . Abnormal uterine bleeding 10/13/2014  . DUB (dysfunctional uterine bleeding)   . Gestational diabetes   . Pregnancy induced hypertension    Past Surgical History:  Procedure Laterality Date  . NO PAST SURGERIES     Family History: family history includes Diabetes in her mother; Heart disease in her mother; Hypertension in her mother; Lung cancer in her father. Social History:  reports that she has never smoked. She has never used smokeless tobacco. She reports previous alcohol use. She reports that she does not use drugs.     Maternal Diabetes: Yes:  Diabetes Type:  Diet controlled Genetic Screening: Normal Maternal Ultrasounds/Referrals: Normal Fetal Ultrasounds or other Referrals:  None Maternal Substance Abuse:  No Significant Maternal Medications:  None Significant Maternal Lab Results:  Group B Strep negative Other Comments:  None  Review of Systems  Cardiovascular:       Some dependent edema in the lower extremeties (+1) and in the lower section of her belly/mons pubis  Neurological: Negative for dizziness and headaches.  All other systems reviewed and are negative.  Maternal Medical History:  Prenatal complications: PIH.     Dilation: Closed Effacement (%): Thick Exam by:: CNM Blood pressure (!) 154/80, pulse 76, temperature 98.7 F (37.1 C), temperature source Oral. Maternal Exam:  Uterine Assessment: No contractions, some mild cramping for the past 3 days   Abdomen: Patient reports no abdominal tenderness. Fetal presentation: vertex Vertex presentation  confirmed by Dr. Myriam Jacobson with bedside U/S. Baby is not engaged at all in pelvis. Baby feels large, mom confirms this one feels bigger than her last babies.  Introitus: Normal vulva. Normal vagina.  Pelvis: adequate for delivery.   Cervix: Cervix evaluated by digital exam.     Fetal Exam Fetal Monitor Review: Mode: ultrasound.   Baseline rate: 130.  Variability: moderate (6-25 bpm).   Pattern: accelerations present and no decelerations.    Fetal State Assessment: Category I - tracings are normal.     Physical Exam Vitals and nursing note reviewed.  Constitutional:      Appearance: Normal appearance. She is obese.  HENT:     Nose: Nose normal.     Mouth/Throat:     Mouth: Mucous membranes are moist.  Eyes:     Pupils: Pupils are equal, round, and reactive to light.  Cardiovascular:     Rate and Rhythm: Normal rate and regular rhythm.     Pulses: Normal pulses.  Pulmonary:     Effort: Pulmonary effort is normal.  Genitourinary:    General: Normal vulva.  Musculoskeletal:        General: Swelling (dependent edema in lower belly/mons pubis) present.     Cervical back: Normal range of motion.     Right lower leg: Edema present.     Left lower  leg: Edema present.  Skin:    General: Skin is warm.     Capillary Refill: Capillary refill takes less than 2 seconds.  Neurological:     Mental Status: She is alert and oriented to person, place, and time.  Psychiatric:        Mood and Affect: Mood normal.        Behavior: Behavior normal.        Thought Content: Thought content normal.        Judgment: Judgment normal.     Prenatal labs: ABO, Rh: O/Positive/-- (03/18 1530) Antibody: Negative (03/18 1530) Rubella: 4.92 (03/18 1530) RPR: Non Reactive (06/30 0917)  HBsAg: Negative (03/18 1530)  HIV: Non Reactive (06/30 0917)  GBS: Negative/-- (08/27 1019)   Assessment/Plan: [id number] at [redacted]w[redacted]d here for IOL for gHTN & A1GDM Admit to inpatient, will begin IOL with buccal Cytotec  and progress to FB, Pitocin/AROM Preeclampsia labs ordered/collected Anticipate NSVD  Edd Arbour, CNM, MSN, [facility] 12/21/19 5:35 PM

## 2019-12-22 LAB — GLUCOSE, CAPILLARY
Glucose-Capillary: 73 mg/dL (ref 70–99)
Glucose-Capillary: 60 mg/dL — ABNORMAL LOW (ref 70–99)
Glucose-Capillary: 77 mg/dL (ref 70–99)
Glucose-Capillary: 108 mg/dL — ABNORMAL HIGH (ref 70–99)
Glucose-Capillary: 82 mg/dL (ref 70–99)

## 2019-12-22 LAB — RPR: RPR Ser Ql: NONREACTIVE

## 2019-12-22 MED ORDER — MISOPROSTOL 25 MCG QUARTER TABLET
ORAL_TABLET | ORAL | Status: AC
  Administered 2019-12-22: 25 ug via VAGINAL
  Filled 2019-12-22: qty 1

## 2019-12-22 MED ORDER — OXYTOCIN-SODIUM CHLORIDE 30-0.9 UT/500ML-% IV SOLN
1.0000 m[IU]/min | INTRAVENOUS | Status: DC
  Administered 2019-12-22: 2 m[IU]/min via INTRAVENOUS
  Filled 2019-12-22: qty 500

## 2019-12-22 MED ORDER — LABETALOL HCL 5 MG/ML IV SOLN
40.0000 mg | INTRAVENOUS | Status: DC | PRN
  Administered 2019-12-22 – 2019-12-23 (×2): 40 mg via INTRAVENOUS
  Filled 2019-12-22 (×2): qty 8

## 2019-12-22 MED ORDER — HYDRALAZINE HCL 20 MG/ML IJ SOLN
10.0000 mg | INTRAMUSCULAR | Status: DC | PRN
  Administered 2019-12-23: 10 mg via INTRAVENOUS
  Filled 2019-12-22: qty 1

## 2019-12-22 MED ORDER — MISOPROSTOL 25 MCG QUARTER TABLET
ORAL_TABLET | ORAL | Status: AC
  Filled 2019-12-22: qty 1

## 2019-12-22 MED ORDER — MAGNESIUM SULFATE BOLUS VIA INFUSION
4.0000 g | Freq: Once | INTRAVENOUS | Status: AC
  Administered 2019-12-22: 4 g via INTRAVENOUS
  Filled 2019-12-22: qty 1000

## 2019-12-22 MED ORDER — LABETALOL HCL 5 MG/ML IV SOLN
20.0000 mg | INTRAVENOUS | Status: DC | PRN
  Administered 2019-12-22 – 2019-12-23 (×2): 20 mg via INTRAVENOUS
  Filled 2019-12-22 (×2): qty 4

## 2019-12-22 MED ORDER — MISOPROSTOL 25 MCG QUARTER TABLET
25.0000 ug | ORAL_TABLET | ORAL | Status: DC
  Administered 2019-12-22: 25 ug via VAGINAL

## 2019-12-22 MED ORDER — LABETALOL HCL 5 MG/ML IV SOLN
80.0000 mg | INTRAVENOUS | Status: DC | PRN
  Administered 2019-12-22 – 2019-12-23 (×2): 80 mg via INTRAVENOUS
  Filled 2019-12-22 (×2): qty 16

## 2019-12-22 MED ORDER — TERBUTALINE SULFATE 1 MG/ML IJ SOLN: 0.2500 mg | Freq: Once | INTRAMUSCULAR | Status: DC | PRN

## 2019-12-22 MED ORDER — MAGNESIUM SULFATE 40 GM/1000ML IV SOLN
2.0000 g/h | INTRAVENOUS | Status: DC
  Administered 2019-12-22 – 2019-12-23 (×2): 2 g/h via INTRAVENOUS
  Filled 2019-12-22 (×3): qty 1000

## 2019-12-22 MED ORDER — MISOPROSTOL 25 MCG QUARTER TABLET: 25.0000 ug | ORAL_TABLET | Freq: Once | ORAL | Status: AC

## 2019-12-22 NOTE — Progress Notes (Signed)
LABOR PROGRESS NOTE  Cindia Hustead is a 39 y.o. G3P2002 at [redacted]w[redacted]d admitted for IOL due to gHTN and A1GDM.   Subjective: Doing well, feeling some contractions. Denies HA, vision changes, CP/SOB.   Objective: BP (!) 153/80   Pulse (!) 59   Temp 98.2 F (36.8 C) (Oral)   Resp 18   Ht 4\' 11"  (1.499 m)   Wt 98.9 kg   LMP  (LMP Unknown)   SpO2 97%   BMI 44.03 kg/m  or  Vitals:   12/22/19 0915 12/22/19 0939 12/22/19 1002 12/22/19 1146  BP: (!) 165/78 (!) 143/64 (!) 141/62 (!) 153/80  Pulse: 62 (!) 57 61 (!) 59  Resp: 16  16 18   Temp:      TempSrc:      SpO2:      Weight:      Height:        Dilation: 1 Effacement (%): Thick Cervical Position: Posterior Station: -3 Presentation: Vertex (per bedside U/S this am, by 12/24/19 RN) Exam by:: Dr. Erven Colla: baseline rate 120, moderate varibility, +acel, -decel Toco: Irregular   Labs: Lab Results  Component Value Date   WBC 10.8 (H) 12/21/2019   HGB 11.8 (L) 12/21/2019   HCT 36.8 12/21/2019   MCV 82.3 12/21/2019   PLT 228 12/21/2019    Patient Active Problem List   Diagnosis Date Noted  . Gestational hypertension 12/21/2019  . Gestational diabetes mellitus in childbirth, diet controlled 10/10/2019  . BMI 40.0-44.9, adult (HCC) 08/12/2019  . Language barrier 08/12/2019  . HGSIL (high grade squamous intraepithelial lesion) on Pap smear of cervix 06/30/2019  . Supervision of high risk pregnancy, antepartum 06/18/2019  . AMA (advanced maternal age) multigravida 35+ 06/18/2019  . Obesity in pregnancy 06/18/2019    Assessment / Plan: 39 y.o. G3P2002 at [redacted]w[redacted]d here for IOL due gHTN and A1GDM.   Labor: Minimal progression since last week. S/p cytotec x3, will place additional vaginal cytotec. Attempted FB placement with speculum however unsuccessful.  Fetal Wellbeing:  Cat 1 strip  Pain Control:  PRN Anticipated MOD:  SVD  #Pre-eclampsia with severe features: Stable.  Due to severe range BP. Asymptomatic.  Labetalol protocol and Mag on board.   #A1GDM: Stable.  Glucose 70s. Q4 CBGS.    24, DO  Family Medicine PGY-3  12/22/2019, 1:00 PM

## 2019-12-22 NOTE — Progress Notes (Addendum)
LABOR PROGRESS NOTE  Debbie Strickland is a 39 y.o. G3P2002 at [redacted]w[redacted]d  admitted for IOL due to gHTN, now superimposed with pre-eclampsia with severe features and A1GDM.   Subjective: Doing well. No headache, vision changes or other concerns. Partner and daughter at bedside.  Objective: BP (!) 146/76   Pulse 62   Temp 98 F (36.7 C) (Oral)   Resp 17   Ht 4\' 11"  (1.499 m)   Wt 98.9 kg   LMP  (LMP Unknown)   SpO2 97%   BMI 44.03 kg/m  or  Vitals:   12/22/19 1727 12/22/19 1802 12/22/19 1902 12/22/19 1938  BP: 137/77 (!) 151/71 (!) 146/76   Pulse: 67 (!) 55 62   Resp: 18 16 17    Temp:    98 F (36.7 C)  TempSrc:    Oral  SpO2:      Weight:      Height:        Dilation: 1.5 Effacement (%): 60 Cervical Position: Posterior Station: -3 Presentation: Vertex Exam by:: Dr. FHT: baseline rate 120, moderate varibility, +acel, -decel Toco: every 4-8 minutes  Labs: Lab Results  Component Value Date   WBC 10.8 (H) 12/21/2019   HGB 11.8 (L) 12/21/2019   HCT 36.8 12/21/2019   MCV 82.3 12/21/2019   PLT 228 12/21/2019    Patient Active Problem List   Diagnosis Date Noted  . Gestational hypertension 12/21/2019  . Gestational diabetes mellitus in childbirth, diet controlled 10/10/2019  . BMI 40.0-44.9, adult (HCC) 08/12/2019  . Language barrier 08/12/2019  . HGSIL (high grade squamous intraepithelial lesion) on Pap smear of cervix 06/30/2019  . Supervision of high risk pregnancy, antepartum 06/18/2019  . AMA (advanced maternal age) multigravida 35+ 06/18/2019  . Obesity in pregnancy 06/18/2019    Assessment / Plan: 39 y.o. G3P2002 at [redacted]w[redacted]d here for IOL due to GHTN (now SIPE with severe features) and A1GDM.  Labor: S/p multiple doses of cytotec (last dose buccal @1712 ). S/p FB placement @1945 . Plan to recheck in 4 hours s/p last cytotec placement or sooner as clinically indicated. Fetal Wellbeing:  Cat 1 strip Pain Control:  Non-medicated  Anticipated MOD:   SVD   #SIPE with severe features on gHTN:  Stable, consistently elevated with average SBP 140's. Asymptomatic.  --IV Mg  --Labetalol protocol as needed  #A1GDM: Glucose 70's. Q4 CBGs.   24, MD OB Fellow, Faculty [facility] 12/22/2019 7:58 PM

## 2019-12-22 NOTE — Progress Notes (Signed)
Labor Progress Note  Ipad interpreter offered for encounter, patient and family decline and would like to interpret for patient.  Debbie Strickland is a 39 y.o. G3P2002 at [redacted]w[redacted]d presented for IOL gHTN and A1GDM.  S: Doing well without complaints. Denies headaches, vision changes, cp, sob, LE edema.   O:  BP (!) 159/75   Pulse (!) 50   Temp 97.9 F (36.6 C) (Oral)   Resp 18   Ht 4\' 11"  (1.499 m)   Wt 98.9 kg   LMP  (LMP Unknown)   SpO2 97%   BMI 44.03 kg/m  EFM: baseline 120bpm/mod variability/+ accels/no decels Toco: q2-3 minutes, some difficulty tracing  CVE: Dilation: 1 Effacement (%): Thick Cervical Position: Posterior Station: -3 Exam by:: Dr. [email or web address]   A&P: 39 y.o. 24 [redacted]w[redacted]d presented for IOL gHTN and A1GDM.  #IOL: S/p cytotec x3. Minimal cervical change since last exam, still thick. Will re-dose cytotec. Consider FB at next check. #Pain: PRN, desires CSE #FWB: cat 1 #GBS negative #preE w/ SF: Diagnosed today in clinic with gHTN and sent over for IOL. Has now ruled in for PreE w/ SF given multiple severe range BP. P/C 1.59. LFTs, platelets wnl.  Asymptomatic. Currently on Mg. Continues to have elevated BP. Labetalol 20 mg IV given. #A1GDM: q4 BGL checks during latent labor, increase to q4 during active labor. Has been hypoglycemic overnight, but asymptomatic. Has been eating.  [redacted]w[redacted]d, MD 7:12 AM

## 2019-12-22 NOTE — Progress Notes (Signed)
Labor Progress Note  Ipad interpreter offered for encounter, patient and family decline and would like to interpret for patient.  Debbie Strickland is a 39 y.o. G3P2002 at [redacted]w[redacted]d presented for IOL gHTN and A1GDM.  S: Doing well without complaints. Denies headaches, vision changes, cp, sob, LE edema.   O:  BP (!) 153/72   Pulse (!) 56   Temp 97.8 F (36.6 C) (Oral)   Resp 18   Ht 4\' 11"  (1.499 m)   Wt 98.9 kg   LMP  (LMP Unknown)   BMI 44.03 kg/m  EFM: baseline 120bpm/mod variability/+ accels/no decels Toco: q2-3 minutes, some difficulty tracing  CVE: Dilation: Fingertip Effacement (%): Thick Cervical Position: Posterior Station: -3 Exam by:: Dr. [email or web address]   A&P: 39 y.o. 24 [redacted]w[redacted]d presented for IOL gHTN and A1GDM.  #IOL: S/p cytotec x2. Minimal cervical change since last exam, still thick. Will re-dose cytotec and consider FB placement at next exam if appropriate. #Pain: PRN, desires CSE #FWB: cat 1 #GBS negative #preE w/ SF: Diagnosed today in clinic with gHTN and sent over for IOL. Has now ruled in for PreE w/ SF given multiple severe range BP. P/C 1.59. LFTs, platelets wnl.  Asymptomatic. Will initiate Mg and anti-hypertensive protocol at this time after discussion with Dr. [redacted]w[redacted]d. Continue to monitor. #A1GDM: q4 BGL checks during latent labor, increase to q4 during active labor.  [name], MD 1:22 AM

## 2019-12-22 NOTE — Progress Notes (Addendum)
LABOR PROGRESS NOTE  Debbie Strickland is a 39 y.o. G3P2002 at [redacted]w[redacted]d  admitted for IOL due to gHTN, now with pre-eclampsia with severe features and A1GDM.   Subjective: Doing well, can feel intermittent contractions.   Objective: BP (!) 81/62   Pulse 63   Temp 98.1 F (36.7 C) (Oral)   Resp 18   Ht 4\' 11"  (1.499 m)   Wt 98.9 kg   LMP  (LMP Unknown)   SpO2 97%   BMI 44.03 kg/m  or  Vitals:   12/22/19 1502 12/22/19 1602 12/22/19 1704 12/22/19 1706  BP: (!) 143/73 (!) 149/68  (!) 81/62  Pulse: (!) 56 (!) 57  63  Resp: 18 20 18    Temp:  98.1 F (36.7 C)    TempSrc:  Oral    SpO2:      Weight:      Height:        Dilation: 1.5 Effacement (%): Thick Cervical Position: Posterior Station: -3 Presentation: Vertex Exam by:: Dr. FHT: baseline rate 120, moderate varibility, +acel, -decel Toco: Every 3-9 min   Labs: Lab Results  Component Value Date   WBC 10.8 (H) 12/21/2019   HGB 11.8 (L) 12/21/2019   HCT 36.8 12/21/2019   MCV 82.3 12/21/2019   PLT 228 12/21/2019    Patient Active Problem List   Diagnosis Date Noted  . Gestational hypertension 12/21/2019  . Gestational diabetes mellitus in childbirth, diet controlled 10/10/2019  . BMI 40.0-44.9, adult (HCC) 08/12/2019  . Language barrier 08/12/2019  . HGSIL (high grade squamous intraepithelial lesion) on Pap smear of cervix 06/30/2019  . Supervision of high risk pregnancy, antepartum 06/18/2019  . AMA (advanced maternal age) multigravida 35+ 06/18/2019  . Obesity in pregnancy 06/18/2019    Assessment / Plan: 39 y.o. G3P2002 at [redacted]w[redacted]d here for IOL due to pre-e with severe features and A1GDM  Labor: Minimal progression with challenging cervical exam for FB placement. Will do an additional buccal cytotec, s/p cytotec x4 with some mild cervical softening noted. Fetal Wellbeing:  Cat 1 strip  Pain Control:  Non-medicated  Anticipated MOD:  SVD   #Pre-e with severe features:  Stable, consistently  elevated with average SBP 140's. Asymptomatic.  --IV Mg  --Labetalol protocol as needed   #A1GDM: Glucose 70's. Q4 CBGs.   24, DO  Family Medicine PGY-3  12/22/2019, 5:15 PM

## 2019-12-22 NOTE — Progress Notes (Signed)
LABOR PROGRESS NOTE  Debbie Strickland is a 39 y.o. G3P2002 at [redacted]w[redacted]d  admitted for IOL due to gHTN, now superimposed with pre-eclampsia with severe features and A1GDM.   Subjective: Doing well. No headache, vision changes or other concerns.   Objective: BP (!) 154/78    Pulse (!) 55    Temp 98 F (36.7 C) (Oral)    Resp 18    Ht 4\' 11"  (1.499 m)    Wt 98.9 kg    LMP  (LMP Unknown)    SpO2 97%    BMI 44.03 kg/m  or  Vitals:   12/22/19 1902 12/22/19 1938 12/22/19 2011 12/22/19 2105  BP: (!) 146/76  (!) 155/78 (!) 154/78  Pulse: 62  (!) 57 (!) 55  Resp: 17  18 18   Temp:  98 F (36.7 C)    TempSrc:  Oral    SpO2:      Weight:      Height:        Dilation: 1.5 Effacement (%): 60 Cervical Position: Posterior Station: -3 Presentation: Vertex Exam by:: Dr. FHT: baseline rate 120, moderate varibility, +acel, -decel Toco: every 4-6 minutes  Labs: Lab Results  Component Value Date   WBC 10.8 (H) 12/21/2019   HGB 11.8 (L) 12/21/2019   HCT 36.8 12/21/2019   MCV 82.3 12/21/2019   PLT 228 12/21/2019    Patient Active Problem List   Diagnosis Date Noted   Gestational hypertension 12/21/2019   Gestational diabetes mellitus in childbirth, diet controlled 10/10/2019   BMI 40.0-44.9, adult (HCC) 08/12/2019   Language barrier 08/12/2019   HGSIL (high grade squamous intraepithelial lesion) on Pap smear of cervix 06/30/2019   Supervision of high risk pregnancy, antepartum 06/18/2019   AMA (advanced maternal age) multigravida 35+ 06/18/2019   Obesity in pregnancy 06/18/2019    Assessment / Plan: 39 y.o. G3P2002 at [redacted]w[redacted]d here for IOL due to GHTN (now SIPE with severe features) and A1GDM.  Labor: S/p cytotecx6. S/p FB placement @1945 . Cervix thin around perimeter of FB, will initiate pitocin at this time. Fetal Wellbeing:  Cat 1 strip Pain Control:  PRN, desires epidural Anticipated MOD:  SVD   #SIPE with severe features:  Stable, consistently elevated BP.  Asymptomatic.  --IV Mg  --Labetalol protocol as needed  #A1GDM: Glucose 70's. Q4 CBGs.   24, MD OB Fellow, Faculty Practice 12/22/2019 9:20 PM

## 2019-12-23 ENCOUNTER — Inpatient Hospital Stay (HOSPITAL_COMMUNITY): Payer: Medicaid Other | Admitting: Anesthesiology

## 2019-12-23 ENCOUNTER — Encounter (HOSPITAL_COMMUNITY): Payer: Self-pay | Admitting: Obstetrics and Gynecology

## 2019-12-23 DIAGNOSIS — O134 Gestational [pregnancy-induced] hypertension without significant proteinuria, complicating childbirth: Secondary | ICD-10-CM

## 2019-12-23 DIAGNOSIS — O24420 Gestational diabetes mellitus in childbirth, diet controlled: Secondary | ICD-10-CM

## 2019-12-23 LAB — CBC
HCT: 38.4 % (ref 36.0–46.0)
Hemoglobin: 12 g/dL (ref 12.0–15.0)
MCH: 25.9 pg — ABNORMAL LOW (ref 26.0–34.0)
MCHC: 31.3 g/dL (ref 30.0–36.0)
MCV: 82.8 fL (ref 80.0–100.0)
Platelets: 232 10*3/uL (ref 150–400)
RBC: 4.64 MIL/uL (ref 3.87–5.11)
RDW: 15.3 % (ref 11.5–15.5)
WBC: 16.1 10*3/uL — ABNORMAL HIGH (ref 4.0–10.5)
nRBC: 0 % (ref 0.0–0.2)

## 2019-12-23 LAB — GLUCOSE, CAPILLARY
Glucose-Capillary: 83 mg/dL (ref 70–99)
Glucose-Capillary: 95 mg/dL (ref 70–99)
Glucose-Capillary: 100 mg/dL — ABNORMAL HIGH (ref 70–99)
Glucose-Capillary: 111 mg/dL — ABNORMAL HIGH (ref 70–99)

## 2019-12-23 MED ORDER — TETANUS-DIPHTH-ACELL PERTUSSIS 5-2.5-18.5 LF-MCG/0.5 IM SUSP: 0.5000 mL | Freq: Once | INTRAMUSCULAR | Status: DC

## 2019-12-23 MED ORDER — ONDANSETRON HCL 4 MG PO TABS: 4.0000 mg | ORAL_TABLET | ORAL | Status: DC | PRN

## 2019-12-23 MED ORDER — SENNOSIDES-DOCUSATE SODIUM 8.6-50 MG PO TABS
2.0000 | ORAL_TABLET | ORAL | Status: DC
  Administered 2019-12-23 – 2019-12-24 (×2): 2 via ORAL
  Filled 2019-12-23 (×2): qty 2

## 2019-12-23 MED ORDER — DIPHENHYDRAMINE HCL 50 MG/ML IJ SOLN: 12.5000 mg | INTRAMUSCULAR | Status: DC | PRN

## 2019-12-23 MED ORDER — LIDOCAINE HCL (PF) 1 % IJ SOLN
INTRAMUSCULAR | Status: DC | PRN
  Administered 2019-12-23: 11 mL via EPIDURAL

## 2019-12-23 MED ORDER — PHENYLEPHRINE 40 MCG/ML (10ML) SYRINGE FOR IV PUSH (FOR BLOOD PRESSURE SUPPORT)
80.0000 ug | PREFILLED_SYRINGE | INTRAVENOUS | Status: DC | PRN
  Administered 2019-12-23: 80 ug via INTRAVENOUS
  Filled 2019-12-23: qty 10

## 2019-12-23 MED ORDER — WITCH HAZEL-GLYCERIN EX PADS: 1.0000 "application " | MEDICATED_PAD | CUTANEOUS | Status: DC | PRN

## 2019-12-23 MED ORDER — HYDRALAZINE HCL 20 MG/ML IJ SOLN: 10.0000 mg | INTRAMUSCULAR | Status: DC | PRN

## 2019-12-23 MED ORDER — EPHEDRINE 5 MG/ML INJ: 10.0000 mg | INTRAVENOUS | Status: DC | PRN

## 2019-12-23 MED ORDER — MAGNESIUM SULFATE 40 GM/1000ML IV SOLN
2.0000 g/h | INTRAVENOUS | Status: DC
  Administered 2019-12-24: 2 g/h via INTRAVENOUS
  Filled 2019-12-23: qty 1000

## 2019-12-23 MED ORDER — IBUPROFEN 600 MG PO TABS
600.0000 mg | ORAL_TABLET | Freq: Four times a day (QID) | ORAL | Status: DC
  Administered 2019-12-23 – 2019-12-25 (×7): 600 mg via ORAL
  Filled 2019-12-23 (×7): qty 1

## 2019-12-23 MED ORDER — PHENYLEPHRINE 40 MCG/ML (10ML) SYRINGE FOR IV PUSH (FOR BLOOD PRESSURE SUPPORT)
80.0000 ug | PREFILLED_SYRINGE | INTRAVENOUS | Status: DC | PRN
  Administered 2019-12-23: 80 ug via INTRAVENOUS

## 2019-12-23 MED ORDER — LACTATED RINGERS IV SOLN
500.0000 mL | Freq: Once | INTRAVENOUS | Status: AC
  Administered 2019-12-23: 500 mL via INTRAVENOUS

## 2019-12-23 MED ORDER — LABETALOL HCL 5 MG/ML IV SOLN: 80.0000 mg | INTRAVENOUS | Status: DC | PRN

## 2019-12-23 MED ORDER — COCONUT OIL OIL: 1.0000 "application " | TOPICAL_OIL | Status: DC | PRN

## 2019-12-23 MED ORDER — ACETAMINOPHEN 325 MG PO TABS: 650.0000 mg | ORAL_TABLET | ORAL | Status: DC | PRN

## 2019-12-23 MED ORDER — LABETALOL HCL 5 MG/ML IV SOLN: 20.0000 mg | INTRAVENOUS | Status: DC | PRN

## 2019-12-23 MED ORDER — DOCUSATE SODIUM 100 MG PO CAPS
100.0000 mg | ORAL_CAPSULE | Freq: Two times a day (BID) | ORAL | Status: DC
  Administered 2019-12-23 – 2019-12-25 (×4): 100 mg via ORAL
  Filled 2019-12-23 (×4): qty 1

## 2019-12-23 MED ORDER — SIMETHICONE 80 MG PO CHEW: 80.0000 mg | CHEWABLE_TABLET | ORAL | Status: DC | PRN

## 2019-12-23 MED ORDER — BENZOCAINE-MENTHOL 20-0.5 % EX AERO: 1.0000 "application " | INHALATION_SPRAY | CUTANEOUS | Status: DC | PRN

## 2019-12-23 MED ORDER — ONDANSETRON HCL 4 MG/2ML IJ SOLN: 4.0000 mg | INTRAMUSCULAR | Status: DC | PRN

## 2019-12-23 MED ORDER — SODIUM CHLORIDE (PF) 0.9 % IJ SOLN
INTRAMUSCULAR | Status: DC | PRN
Start: 2019-12-23 — End: 2019-12-23
  Administered 2019-12-23: 12 mL/h via EPIDURAL

## 2019-12-23 MED ORDER — DIBUCAINE (PERIANAL) 1 % EX OINT: 1.0000 "application " | TOPICAL_OINTMENT | CUTANEOUS | Status: DC | PRN

## 2019-12-23 MED ORDER — LABETALOL HCL 5 MG/ML IV SOLN: 40.0000 mg | INTRAVENOUS | Status: DC | PRN

## 2019-12-23 MED ORDER — DIPHENHYDRAMINE HCL 25 MG PO CAPS: 25.0000 mg | ORAL_CAPSULE | Freq: Four times a day (QID) | ORAL | Status: DC | PRN

## 2019-12-23 MED ORDER — PRENATAL MULTIVITAMIN CH
1.0000 | ORAL_TABLET | Freq: Every day | ORAL | Status: DC
  Administered 2019-12-24 – 2019-12-25 (×2): 1 via ORAL
  Filled 2019-12-23 (×2): qty 1

## 2019-12-23 MED ORDER — MEASLES, MUMPS & RUBELLA VAC IJ SOLR: 0.5000 mL | Freq: Once | INTRAMUSCULAR | Status: DC

## 2019-12-23 MED ORDER — FENTANYL-BUPIVACAINE-NACL 0.5-0.125-0.9 MG/250ML-% EP SOLN
12.0000 mL/h | EPIDURAL | Status: DC | PRN
  Filled 2019-12-23: qty 250

## 2019-12-23 NOTE — Anesthesia Preprocedure Evaluation (Signed)
Anesthesia Evaluation  Patient identified by MRN, date of birth, ID band Patient awake    Reviewed: Allergy & Precautions, NPO status , Patient's Chart, lab work & pertinent test results  Airway Mallampati: II  TM Distance: >3 FB Neck ROM: Full    Dental no notable dental hx.    Pulmonary neg pulmonary ROS,    Pulmonary exam normal breath sounds clear to auscultation       Cardiovascular hypertension, negative cardio ROS Normal cardiovascular exam Rhythm:Regular Rate:Normal     Neuro/Psych negative neurological ROS  negative psych ROS   GI/Hepatic negative GI ROS, Neg liver ROS,   Endo/Other  negative endocrine ROSdiabetes  Renal/GU negative Renal ROS  negative genitourinary   Musculoskeletal negative musculoskeletal ROS (+)   Abdominal (+) + obese,   Peds negative pediatric ROS (+)  Hematology negative hematology ROS (+)   Anesthesia Other Findings   Reproductive/Obstetrics (+) Pregnancy                             Anesthesia Physical Anesthesia Plan  ASA: II  Anesthesia Plan: Epidural   Post-op Pain Management:    Induction:   PONV Risk Score and Plan:   Airway Management Planned:   Additional Equipment:   Intra-op Plan:   Post-operative Plan:   Informed Consent:   Plan Discussed with:   Anesthesia Plan Comments:         Anesthesia Quick Evaluation

## 2019-12-23 NOTE — Progress Notes (Signed)
LABOR PROGRESS NOTE  Debbie Strickland is a 39 y.o. G3P2002 at [redacted]w[redacted]d  admitted for IOL due to gHTN, now superimposed with pre-eclampsia with severe features and A1GDM.   Subjective: Doing well. No headache, vision changes or other concerns.   Objective: BP (!) 155/67   Pulse 60   Temp 98 F (36.7 C) (Axillary)   Resp 18   Ht 4\' 11"  (1.499 m)   Wt 98.9 kg   LMP  (LMP Unknown)   SpO2 97%   BMI 44.03 kg/m  or  Vitals:   12/22/19 2105 12/22/19 2202 12/22/19 2302 12/22/19 2339  BP: (!) 154/78 (!) 155/67 (!) 155/62 (!) 155/67  Pulse: (!) 55 (!) 55 61 60  Resp: 18 18 18 18   Temp:    98 F (36.7 C)  TempSrc:    Axillary  SpO2:      Weight:      Height:        Dilation: 4 Effacement (%): 50 Cervical Position: Posterior Station: -3 Presentation: Vertex Exam by:: Dr. : baseline rate 120, moderate varibility, +acel, -decel Toco: every 2-5 minutes  Labs: Lab Results  Component Value Date   WBC 10.8 (H) 12/21/2019   HGB 11.8 (L) 12/21/2019   HCT 36.8 12/21/2019   MCV 82.3 12/21/2019   PLT 228 12/21/2019    Patient Active Problem List   Diagnosis Date Noted  . Gestational hypertension 12/21/2019  . Gestational diabetes mellitus in childbirth, diet controlled 10/10/2019  . BMI 40.0-44.9, adult (HCC) 08/12/2019  . Language barrier 08/12/2019  . HGSIL (high grade squamous intraepithelial lesion) on Pap smear of cervix 06/30/2019  . Supervision of high risk pregnancy, antepartum 06/18/2019  . AMA (advanced maternal age) multigravida 35+ 06/18/2019  . Obesity in pregnancy 06/18/2019    Assessment / Plan: 39 y.o. G3P2002 at [redacted]w[redacted]d here for IOL due to GHTN (now SIPE with severe features) and A1GDM.  Labor: S/p cytotecx6. S/p FB, fell out at ~2300. Continue pitocin.  Fetal Wellbeing:  Cat 1 strip Pain Control:  PRN, desires epidural Anticipated MOD:  SVD   #SIPE with severe features:  Stable, consistently elevated BP. Asymptomatic.  --IV Mg   --Labetalol protocol as needed  #A1GDM: Glucose 70's. Q4 CBGs.   24, MD OB Fellow, Faculty Practice 12/23/2019 12:11 AM

## 2019-12-23 NOTE — Anesthesia Procedure Notes (Signed)
Epidural Patient location during procedure: OB Start time: 12/23/2019 9:04 AM End time: 12/23/2019 9:23 AM  Staffing Anesthesiologist: Lowella Curb, MD Performed: anesthesiologist   Preanesthetic Checklist Completed: patient identified, IV checked, site marked, risks and benefits discussed, surgical consent, monitors and equipment checked, pre-op evaluation and timeout performed  Epidural Patient position: sitting Prep: ChloraPrep Patient monitoring: heart rate, cardiac monitor, continuous pulse ox and blood pressure Approach: midline Location: L2-L3 Injection technique: LOR saline  Needle:  Needle type: Tuohy  Needle gauge: 17 G Needle length: 9 cm Needle insertion depth: 7 cm Catheter type: closed end flexible Catheter size: 20 Guage Catheter at skin depth: 12 cm Test dose: negative  Assessment Events: blood not aspirated, injection not painful, no injection resistance, no paresthesia and negative IV test  Additional Notes Reason for block:procedure for pain

## 2019-12-23 NOTE — Discharge Summary (Signed)
Postpartum Discharge Summary  Date of Service updated 12/25/2019     Patient Name: Debbie Strickland DOB: 10-Sep-1980 MRN: [id number]  Date of admission: 12/21/2019 Delivery date:12/23/2019  Delivering provider: Janet Berlin  Date of discharge: 12/25/2019  Admitting diagnosis: Gestational hypertension [O13.9] Intrauterine pregnancy: [redacted]w[redacted]d    Secondary diagnosis:  Active Problems:   Supervision of high risk pregnancy, antepartum   AMA (advanced maternal age) multigravida 35+   Obesity in pregnancy   HGSIL (high grade squamous intraepithelial lesion) on Pap smear of cervix   Language barrier   Gestational diabetes mellitus in childbirth, diet controlled   Gestational hypertension  Additional problems: none    Discharge diagnosis: Term Pregnancy Delivered, Gestational Hypertension and GDM A1                                              Post partum procedures:none Augmentation: AROM and Cytotec Complications: None  Hospital course: Induction of Labor With Vaginal Delivery   39y.o. yo G3P2002 at 329w2das admitted to the hospital 12/21/2019 for induction of labor.  Indication for induction: Gestational hypertension and A1 DM.  Patient had an uncomplicated labor course as follows: Membrane Rupture Time/Date: 8:07 AM ,12/23/2019   Delivery Method:Vaginal, Spontaneous  Episiotomy: None  Lacerations:  Periurethral;Vaginal  Details of delivery can be found in separate delivery note.  Patient had a routine postpartum course. Patient is discharged home 12/25/19.  Newborn Data: Birth date:12/23/2019  Birth time:2:52 PM  Gender:Female  Living status:Living  Apgars:6 ,9  Weight:3150 g   Magnesium Sulfate received: Yes: Seizure prophylaxis BMZ received: No Rhophylac:N/A MMR:N/A T-DaP:Given prenatally Flu: No Transfusion:No  Physical exam  Vitals:   12/24/19 2009 12/25/19 0006 12/25/19 0602 12/25/19 0727  BP: (!) 156/71 127/63 (!) 152/61 (!) 150/56  Pulse: 73 61 60  61  Resp: _0 Temp: 98.2 F (36.8 C) 98.3 F (36.8 C) 98.2 F (36.8 C) 98.6 F (37 C)  TempSrc:  Oral Oral Oral  SpO2: 100% 99% 99% 98%  Weight:      Height:       General: alert, cooperative and no distress Lochia: appropriate Uterine Fundus: firm Incision: N/A DVT Evaluation: No evidence of DVT seen on physical exam. Labs: Lab Results  Component Value Date   WBC 16.1 (H) 12/23/2019   HGB 12.0 12/23/2019   HCT 38.4 12/23/2019   MCV 82.8 12/23/2019   PLT 232 12/23/2019   CMP Latest Ref Rng & Units 12/21/2019  Glucose 70 - 99 mg/dL 80  BUN 6 - 20 mg/dL 7  Creatinine 0.44 - 1.00 mg/dL 0.61  Sodium 135 - 145 mmol/L 136  Potassium 3.5 - 5.1 mmol/L 4.0  Chloride 98 - 111 mmol/L 108  CO2 22 - 32 mmol/L 20(L)  Calcium 8.9 - 10.3 mg/dL 8.3(L)  Total Protein 6.5 - 8.1 g/dL 5.9(L)  Total Bilirubin 0.3 - 1.2 mg/dL 0.2(L)  Alkaline Phos 38 - 126 U/L 96  AST 15 - 41 U/L 19  ALT 0 - 44 U/L 11   Edinburgh Score: Edinburgh Postnatal Depression Scale Screening Tool 12/24/2019  I have been able to laugh and see the funny side of things. 0  I have looked forward with enjoyment to things. 0  I have blamed myself unnecessarily when things went wrong. 0  I have been anxious  or worried for no good reason. 0  I have felt scared or panicky for no good reason. 0  Things have been getting on top of me. 0  I have been so unhappy that I have had difficulty sleeping. 0  I have felt sad or miserable. 0  I have been so unhappy that I have been crying. 0  The thought of harming myself has occurred to me. 0  Edinburgh Postnatal Depression Scale Total 0     After visit meds:  Allergies as of 12/25/2019   No Known Allergies     Medication List    TAKE these medications   aspirin EC 81 MG tablet Take 81 mg by mouth daily. Swallow whole.   NIFEdipine 60 MG 24 hr tablet Commonly known as: PROCARDIA XL/NIFEDICAL XL Take 1 tablet (60 mg total) by mouth daily. Start taking on:  December 26, 2019   One-A-Day Womens Prenatal 1 28-0.8-235 MG Caps Take 1 capsule by mouth daily.   Vitamin D3 50 MCG (2000 UT) capsule Take 1 capsule (2,000 Units total) by mouth daily.        Discharge home in stable condition Infant Feeding: Bottle and Breast Infant Disposition:home with mother Discharge instruction: per After Visit Summary and Postpartum booklet. Activity: Advance as tolerated. Pelvic rest for 6 weeks.  Diet: routine diet Future Appointments: Future Appointments  Date Time Provider Department Center  12/30/2019 10:20 AM [facility] NURSE [facility]  01/25/2020  8:20 AM [facility] LAB [facility]  01/25/2020  8:55 AM Pratt, Tanya S, MD [facility]   Follow up Visit:  Follow-up Information    [facility] Follow up in 1 week(s).   Specialty: Obstetrics and Gynecology Why: BP check  Contact information: [address] [place] [address] [phone]               Please schedule this patient for a In person postpartum visit in 6 weeks with the following provider: MD. Additional Postpartum F/U:Colpo at 6wk visit, 2 hour GTT at 6wk visit and BP check 1 week  High risk pregnancy complicated by: GDM and HTN Delivery mode:  Vaginal, Spontaneous  Anticipated Birth Control:  Depo   12/25/2019 James Arnold, MD   

## 2019-12-23 NOTE — Progress Notes (Signed)
LABOR PROGRESS NOTE  Debbie Strickland is a 39 y.o. G3P2002 at [redacted]w[redacted]d  admitted for IOL due to gHTN, now superimposed with pre-eclampsia with severe features and A1GDM.   Subjective: Doing well. No headache, vision changes or other concerns. Starting to feel contractions more.   Objective: BP (!) 165/84   Pulse (!) 58   Temp 98 F (36.7 C) (Axillary)   Resp 17   Ht 4\' 11"  (1.499 m)   Wt 98.9 kg   LMP  (LMP Unknown)   SpO2 97%   BMI 44.03 kg/m  or  Vitals:   12/23/19 0222 12/23/19 0232 12/23/19 0300 12/23/19 0332  BP: (!) 149/82 (!) 160/78 (!) 144/70 (!) 165/84  Pulse: (!) 55 (!) 57 (!) 57 (!) 58  Resp:   18 17  Temp:      TempSrc:      SpO2:      Weight:      Height:        Dilation: 4 Effacement (%): 50 Cervical Position: Posterior Station: -3 Presentation: Vertex Exam by:: Dr. [email or web address]: baseline rate 120, moderate varibility, +acel, -decel Toco: every 1-3 minutes  Labs: Lab Results  Component Value Date   WBC 10.8 (H) 12/21/2019   HGB 11.8 (L) 12/21/2019   HCT 36.8 12/21/2019   MCV 82.3 12/21/2019   PLT 228 12/21/2019    Patient Active Problem List   Diagnosis Date Noted  . Gestational hypertension 12/21/2019  . Gestational diabetes mellitus in childbirth, diet controlled 10/10/2019  . BMI 40.0-44.9, adult (HCC) 08/12/2019  . Language barrier 08/12/2019  . HGSIL (high grade squamous intraepithelial lesion) on Pap smear of cervix 06/30/2019  . Supervision of high risk pregnancy, antepartum 06/18/2019  . AMA (advanced maternal age) multigravida 35+ 06/18/2019  . Obesity in pregnancy 06/18/2019    Assessment / Plan: 39 y.o. G3P2002 at [redacted]w[redacted]d here for IOL due to GHTN (now SIPE with severe features) and A1GDM.  Labor: S/p cytotecx6. S/p FB, fell out at ~2300. Unchanged from last exam. Continue pitocin.  Fetal Wellbeing:  Cat 1 strip Pain Control:  PRN, desires epidural Anticipated MOD:  SVD   #SIPE with severe features:  Stable, consistently  elevated BP. Has had multiple severe range pressures since last check. Asymptomatic.  --IV Mg  --Labetalol protocol as needed  #A1GDM: Glucose 70's. Q4 CBGs.   [redacted]w[redacted]d, MD OB Fellow, Faculty Practice 12/23/2019 4:30 AM

## 2019-12-24 LAB — GLUCOSE, CAPILLARY
Glucose-Capillary: 86 mg/dL (ref 70–99)
Glucose-Capillary: 85 mg/dL (ref 70–99)
Glucose-Capillary: 87 mg/dL (ref 70–99)

## 2019-12-24 MED ORDER — NIFEDIPINE ER OSMOTIC RELEASE 30 MG PO TB24
30.0000 mg | ORAL_TABLET | Freq: Every day | ORAL | Status: DC
  Administered 2019-12-24 – 2019-12-25 (×2): 30 mg via ORAL
  Filled 2019-12-24 (×2): qty 1

## 2019-12-24 MED ORDER — LACTATED RINGERS IV SOLN: INTRAVENOUS | Status: DC

## 2019-12-24 NOTE — Anesthesia Postprocedure Evaluation (Signed)
Anesthesia Post Note  Patient: Debbie Strickland  Procedure(s) Performed: AN AD HOC LABOR EPIDURAL     Patient location during evaluation: Mother Baby Anesthesia Type: Epidural Level of consciousness: awake and alert Pain management: pain level controlled Vital Signs Assessment: post-procedure vital signs reviewed and stable Respiratory status: spontaneous breathing, nonlabored ventilation and respiratory function stable Cardiovascular status: stable Postop Assessment: no headache, no backache and epidural receding Anesthetic complications: no   No complications documented.  Last Vitals:  Vitals:   12/24/19 0600 12/24/19 0700  BP:    Pulse:    Resp: 17 17  Temp:    SpO2:      Last Pain:  Vitals:   12/24/19 0600  TempSrc:   PainSc: 0-No pain   Pain Goal:                   [employer]

## 2019-12-24 NOTE — Progress Notes (Signed)
Post Partum Day 1 Subjective: no complaints, up ad lib and tolerating PO  Objective: Blood pressure (!) 147/62, pulse (!) 59, temperature 98.2 F (36.8 C), temperature source Oral, resp. rate 18, height 4\' 11"  (1.499 m), weight 98.9 kg, SpO2 96 %, unknown if currently breastfeeding.  Physical Exam:  General: alert, cooperative and no distress Lochia: appropriate Uterine Fundus: firm DVT Evaluation: No evidence of DVT seen on physical exam.  Recent Labs    12/21/19 1911 12/23/19 1719  HGB 11.8* 12.0  HCT 36.8 38.4    Assessment/Plan: D/c magnesium today    LOS: 3 days   12/25/19 12/24/2019, 12:29 PM

## 2019-12-25 DIAGNOSIS — O24430 Gestational diabetes mellitus in the puerperium, diet controlled: Secondary | ICD-10-CM

## 2019-12-25 DIAGNOSIS — O99893 Other specified diseases and conditions complicating puerperium: Secondary | ICD-10-CM

## 2019-12-25 DIAGNOSIS — O99215 Obesity complicating the puerperium: Secondary | ICD-10-CM

## 2019-12-25 DIAGNOSIS — R87613 High grade squamous intraepithelial lesion on cytologic smear of cervix (HGSIL): Secondary | ICD-10-CM

## 2019-12-25 MED ORDER — NIFEDIPINE ER OSMOTIC RELEASE 30 MG PO TB24: 60.0000 mg | ORAL_TABLET | Freq: Every day | ORAL | Status: DC

## 2019-12-25 MED ORDER — NIFEDIPINE ER 60 MG PO TB24
60.0000 mg | ORAL_TABLET | Freq: Every day | ORAL | 1 refills | Status: DC
Start: 2019-12-26 — End: 2019-12-31

## 2019-12-25 MED ORDER — NIFEDIPINE ER OSMOTIC RELEASE 30 MG PO TB24
30.0000 mg | ORAL_TABLET | Freq: Once | ORAL | Status: AC
  Administered 2019-12-25: 30 mg via ORAL
  Filled 2019-12-25: qty 1

## 2019-12-25 MED ORDER — NIFEDIPINE ER OSMOTIC RELEASE 60 MG PO TB24
60.0000 mg | ORAL_TABLET | Freq: Every day | ORAL | 1 refills | Status: DC
Start: 2019-12-26 — End: 2019-12-31

## 2019-12-25 MED ORDER — NIFEDIPINE ER OSMOTIC RELEASE 60 MG PO TB24
60.0000 mg | ORAL_TABLET | Freq: Every day | ORAL | 1 refills | Status: DC
Start: 2019-12-26 — End: 2019-12-25

## 2019-12-25 MED FILL — NIFEdipine ER 60 MG TB24: 60 | 30 days supply | Qty: 30 | Fill #0

## 2019-12-25 NOTE — Discharge Instructions (Signed)
Parto vaginal, cuidados de puerperio Postpartum Care After Vaginal Delivery Lea esta informacin sobre cmo cuidarse desde el momento en que nazca su beb y hasta 6 a 12 semanas despus del parto (perodo del posparto). El mdico tambin podr darle instrucciones ms especficas. Comunquese con su mdico si tiene problemas o preguntas. Siga estas indicaciones en su casa: Hemorragia vaginal  Es normal tener un poco de hemorragia vaginal (loquios) despus del parto. Use un apsito sanitario para el sangrado vaginal y secrecin. ? Durante la primera semana despus del parto, la cantidad y el aspecto de los loquios a menudo es similar a las del perodo menstrual. ? Durante las siguientes semanas disminuir gradualmente hasta convertirse en una secrecin seca amarronada o amarillenta. ? En la mayora de las mujeres, los loquios se detienen completamente entre 4 a 6semanas despus del parto. Los sangrados vaginales pueden variar de mujer a mujer.  Cambie los apsitos sanitarios con frecuencia. Observe si hay cambios en el flujo, como: ? Un aumento repentino en el volumen. ? Cambio en el color. ? Cogulos sanguneos grandes.  Si expulsa un cogulo de sangre por la vagina, gurdelo y llame al mdico para informrselo. No deseche los cogulos de sangre por el inodoro antes de hablar con su mdico.  No use tampones ni se haga duchas vaginales hasta que el mdico la autorice.  Si no est amamantando, volver a tener su perodo entre 6 y 8 semanas despus del parto. Si solamente alimenta al beb con leche materna (lactancia materna exclusiva), podra no volver a tener su perodo hasta que deje de amamantar. Cuidados perineales  Mantenga la zona entre la vagina y el ano (perineo) limpia y seca, como se lo haya indicado el mdico. Utilice apsitos o aerosoles analgsicos y cremas, como se lo hayan indicado.  Si le hicieron un corte en el perineo (episiotoma) o tuvo un desgarro en la vagina, controle la  zona para detectar signos de infeccin hasta que sane. Est atenta a los siguientes signos: ? Aumento del enrojecimiento, la hinchazn o el dolor. ? Presenta lquido o sangre que supura del corte o desgarro. ? Calor. ? Pus o mal olor.  Es posible que le den una botella rociadora para que use en lugar de limpiarse el rea con papel higinico despus de usar el bao. Cuando comience a sanar, podr usar la botella rociadora antes de secarse. Asegrese de secarse suavemente.  Para aliviar el dolor causado por una episiotoma, un desgarro en la vagina o venas hinchadas en el ano (hemorroides), trate de tomar un bao de asiento tibio 2 o 3 veces por da. Un bao de asiento es un bao de agua tibia que se toma mientras se est sentado. El agua solo debe llegar hasta las caderas y cubrir las nalgas. Cuidado de las mamas  En los primeros das despus del parto, las mamas pueden sentirse pesadas, llenas e incmodas (congestin mamaria). Tambin puede escaparse leche de sus senos. El mdico puede sugerirle mtodos para aliviar este malestar. La congestin mamaria debera desaparecer al cabo de unos das.  Si est amamantando: ? Use un sostn que sujete y ajuste bien sus pechos. ? Mantenga los pezones secos y limpios. Aplquese cremas y ungentos, como se lo haya indicado el mdico. ? Es posible que deba usar discos de algodn en el sostn para absorber la leche que se filtre de sus senos. ? Puede tener contracciones uterinas cada vez que amamante durante varias semanas despus del parto. Las contracciones uterinas ayudan al tero a   regresar a su tamao habitual. ? Si tiene algn problema con la lactancia materna, colabore con el mdico o un asesor en lactancia.  Si no est amamantando: ? Evite tocarse mucho las mamas. Al hacerlo, podran producir ms leche. ? Use un sostn que le proporcione el ajuste correcto y compresas fras para reducir la hinchazn. ? No extraiga (saque) leche materna. Esto har que  produzca ms leche. Intimidad y sexualidad  Pregntele al mdico cundo puede retomar la actividad sexual. Esto puede depender de lo siguiente: ? Su riesgo de sufrir infecciones. ? La rapidez con la que est sanando. ? Su comodidad y deseo de retomar la actividad sexual.  Despus del parto, puede quedar embarazada incluso si no ha tenido todava su perodo. Si lo desea, hable con el mdico acerca de los mtodos de control de la natalidad (mtodos anticonceptivos). Medicamentos  Tome los medicamentos de venta libre y los recetados solamente como se lo haya indicado el mdico.  Si le recetaron un antibitico, tmelo como se lo haya indicado el mdico. No deje de tomar el antibitico aunque comience a sentirse mejor. Actividad  Retome sus actividades normales de a poco como se lo haya indicado el mdico. Pregntele al mdico qu actividades son seguras para usted.  Descanse todo lo que pueda. Trate de descansar o tomar una siesta mientras el beb duerme. Comida y bebida   Beba suficiente lquido como para mantener la orina de color amarillo plido.  Coma alimentos ricos en fibras todos los das. Estos pueden ayudarla a prevenir o aliviar el estreimiento. Los alimentos ricos en fibras incluyen, entre otros: ? Panes y cereales integrales. ? Arroz integral. ? Frijoles. ? Frutas y verduras frescas.  No intente perder de peso rpidamente reduciendo el consumo de caloras.  Tome sus vitaminas prenatales hasta la visita de seguimiento de posparto o hasta que su mdico le indique que puede dejar de tomarlas. Estilo de vida  No consuma ningn producto que contenga nicotina o tabaco, como cigarrillos y cigarrillos electrnicos. Si necesita ayuda para dejar de fumar, consulte al mdico.  No beba alcohol, especialmente si est amamantando. Instrucciones generales  Concurra a todas las visitas de seguimiento para usted y el beb, como se lo haya indicado el mdico. La mayora de las mujeres  visita al mdico para un seguimiento de posparto dentro de las primeras 3 a 6 semanas despus del parto. Comunquese con un mdico si:  Se siente incapaz de controlar los cambios que implica tener un hijo y esos sentimientos no desaparecen.  Siente tristeza o preocupacin de forma inusual.  Las mamas se ponen rojas, le duelen o se endurecen.  Tiene fiebre.  Tiene dificultad para retener la orina o para impedir que la orina se escape.  Tiene poco inters o falta de inters en actividades que solan gustarle.  No ha amamantado nada y no ha tenido un perodo menstrual durante 12 semanas despus del parto.  Dej de amamantar al beb y no ha tenido su perodo menstrual durante 12 semanas despus de dejar de amamantar.  Tiene preguntas sobre su cuidado y el del beb.  Elimina un cogulo de sangre grande por la vagina. Solicite ayuda de inmediato si:  Siente dolor en el pecho.  Tiene dificultad para respirar.  Tiene un dolor repentino e intenso en la pierna.  Tiene dolor intenso o clicos en el la parte inferior del abdomen.  Tiene una hemorragia tan intensa de la vagina que empapa ms de un apsito en una hora. El sangrado   no debe ser ms abundante que el perodo ms intenso que haya tenido.  Dolor de cabeza intenso.  Se desmaya.  Tiene visin borrosa o manchas en la vista.  Tiene secrecin vaginal con mal olor.  Tiene pensamientos acerca de lastimarse a usted misma o a su beb. Si alguna vez siente que puede lastimarse a usted misma o a otras personas, o tiene pensamientos de poner fin a su vida, busque ayuda de inmediato. Puede dirigirse al departamento de emergencias ms cercano o llamar a:  El servicio de emergencias de su localidad (911 en [place]  Una lnea de asistencia al suicida y atencin en crisis, como la Lnea Nacional de Prevencin del Suicidio (National Suicide Prevention Lifeline), al 1-800-273-8255. Est disponible las 24 horas del da. Resumen  El  perodo de tiempo justo despus el parto y hasta 6 a 12 semanas despus del parto se denomina perodo posparto.  Retome sus actividades normales de a poco como se lo haya indicado el mdico.  Concurra a todas las visitas de seguimiento para usted y el beb, como se lo haya indicado el mdico. Esta informacin no tiene como fin reemplazar el consejo del mdico. Asegrese de hacerle al mdico cualquier pregunta que tenga. Document Revised: 07/06/2017 Document Reviewed: 03/17/2017 Elsevier Patient Education  2020 Elsevier Inc.  

## 2019-12-25 NOTE — Plan of Care (Signed)
?  Problem: Education: ?Goal: Knowledge of General Education information will improve ?Description: Including pain rating scale, medication(s)/side effects and non-pharmacologic comfort measures ?Outcome: Adequate for Discharge ?  ?Problem: Health Behavior/Discharge Planning: ?Goal: Ability to manage health-related needs will improve ?Outcome: Adequate for Discharge ?  ?Problem: Clinical Measurements: ?Goal: Ability to maintain clinical measurements within normal limits will improve ?Outcome: Adequate for Discharge ?Goal: Will remain free from infection ?Outcome: Adequate for Discharge ?Goal: Diagnostic test results will improve ?Outcome: Adequate for Discharge ?Goal: Respiratory complications will improve ?Outcome: Adequate for Discharge ?Goal: Cardiovascular complication will be avoided ?Outcome: Adequate for Discharge ?  ?Problem: Activity: ?Goal: Risk for activity intolerance will decrease ?Outcome: Adequate for Discharge ?  ?Problem: Nutrition: ?Goal: Adequate nutrition will be maintained ?Outcome: Adequate for Discharge ?  ?Problem: Coping: ?Goal: Level of anxiety will decrease ?Outcome: Adequate for Discharge ?  ?Problem: Elimination: ?Goal: Will not experience complications related to bowel motility ?Outcome: Adequate for Discharge ?Goal: Will not experience complications related to urinary retention ?Outcome: Adequate for Discharge ?  ?Problem: Pain Managment: ?Goal: General experience of comfort will improve ?Outcome: Adequate for Discharge ?  ?Problem: Safety: ?Goal: Ability to remain free from injury will improve ?Outcome: Adequate for Discharge ?  ?Problem: Skin Integrity: ?Goal: Risk for impaired skin integrity will decrease ?Outcome: Adequate for Discharge ?  ?Problem: Education: ?Goal: Knowledge of Childbirth will improve ?Outcome: Adequate for Discharge ?Goal: Ability to make informed decisions regarding treatment and plan of care will improve ?Outcome: Adequate for Discharge ?Goal: Ability to state  and carry out methods to decrease the pain will improve ?Outcome: Adequate for Discharge ?Goal: Individualized Educational Video(s) ?Outcome: Adequate for Discharge ?  ?Problem: Coping: ?Goal: Ability to verbalize concerns and feelings about labor and delivery will improve ?Outcome: Adequate for Discharge ?  ?Problem: Life Cycle: ?Goal: Ability to make normal progression through stages of labor will improve ?Outcome: Adequate for Discharge ?Goal: Ability to effectively push during vaginal delivery will improve ?Outcome: Adequate for Discharge ?  ?Problem: Role Relationship: ?Goal: Will demonstrate positive interactions with the child ?Outcome: Adequate for Discharge ?  ?Problem: Safety: ?Goal: Risk of complications during labor and delivery will decrease ?Outcome: Adequate for Discharge ?  ?Problem: Pain Management: ?Goal: Relief or control of pain from uterine contractions will improve ?Outcome: Adequate for Discharge ?  ?Problem: Education: ?Goal: Knowledge of condition will improve ?Outcome: Adequate for Discharge ?Goal: Individualized Educational Video(s) ?Outcome: Adequate for Discharge ?Goal: Individualized Newborn Educational Video(s) ?Outcome: Adequate for Discharge ?  ?Problem: Activity: ?Goal: Will verbalize the importance of balancing activity with adequate rest periods ?Outcome: Adequate for Discharge ?Goal: Ability to tolerate increased activity will improve ?Outcome: Adequate for Discharge ?  ?Problem: Coping: ?Goal: Ability to identify and utilize available resources and services will improve ?Outcome: Adequate for Discharge ?  ?Problem: Life Cycle: ?Goal: Chance of risk for complications during the postpartum period will decrease ?Outcome: Adequate for Discharge ?  ?Problem: Role Relationship: ?Goal: Ability to demonstrate positive interaction with newborn will improve ?Outcome: Adequate for Discharge ?  ?Problem: Skin Integrity: ?Goal: Demonstration of wound healing without infection will  improve ?Outcome: Adequate for Discharge ?  ?

## 2019-12-25 NOTE — Lactation Note (Addendum)
This note was copied from a baby's chart. Lactation Consultation Note  Patient Name: Debbie Strickland [id number] Date: 12/25/2019 Reason for consult: Initial assessment;Mother's request;Term;Maternal endocrine disorder Type of Endocrine Disorder?: Diabetes  LC in to visit with P3 Mom of term baby at 37 hrs old, on day of discharge.  Baby at 4% weight loss.  Mom breastfed her first 2 babies ages 48 & 44.  Mom needing review of correct positioning and latching.    Mom instructed to unwrap baby, and remove clothing to provide STS at the breast.  Baby had just fed an hour prior, so not showing cues currently.  Demonstrated cross cradle rather than cradle hold to make sure baby can latch deeply to breast.  Reviewed breast massage and hand expression.  Baby opened her mouth and was able to latch deeply, but no sucking at present.  Mom asked if colostrum is good for baby.  With her daughter interpreting, LC explained the benefits of colostrum.  Mom states she has tried to latch baby before she offers formula.  Last night baby fed for 15-20 mins before her formula feeding.  Mom denies any discomfort with latching.  Mom instructed to always offer breast first, and to decrease or eliminate formula as breasts start filling up which could be anytime now.  Engorgement prevention and treatment reviewed.  Hand pump given and demonstrated use.    Encouraged STS and offering the breast with cues.   Offered to assist Mom with an OP lactation appointment, but Mom states she has [facility] and will call prn.  Lactation brochure provided with phone numbers identified for assistance.  Interventions Interventions: Breast feeding basics reviewed;Assisted with latch;Skin to skin;Breast massage;Hand express;Pre-pump if needed;Adjust position;Support pillows;Position options;Hand pump  Lactation Tools Discussed/Used Tools: Pump Breast pump type: Manual WIC Program: Yes Pump Review: Setup, frequency, and  cleaning Initiated by:: Erby Pian RN IBCLC Date initiated:: 12/25/19   Consult Status Consult Status: Complete    Judee Clara 12/25/2019, 10:35 AM

## 2019-12-30 ENCOUNTER — Ambulatory Visit: Payer: No Typology Code available for payment source

## 2019-12-31 ENCOUNTER — Other Ambulatory Visit: Payer: Self-pay

## 2019-12-31 ENCOUNTER — Ambulatory Visit (INDEPENDENT_AMBULATORY_CARE_PROVIDER_SITE_OTHER): Payer: No Typology Code available for payment source | Admitting: General Practice

## 2019-12-31 VITALS — BP 127/79 | HR 111 | Ht 59.0 in | Wt 189.0 lb

## 2019-12-31 DIAGNOSIS — O133 Gestational [pregnancy-induced] hypertension without significant proteinuria, third trimester: Secondary | ICD-10-CM

## 2019-12-31 DIAGNOSIS — Z0130 Encounter for examination of blood pressure without abnormal findings: Secondary | ICD-10-CM

## 2019-12-31 MED ORDER — AMLODIPINE BESYLATE 5 MG PO TABS: 5.0000 mg | ORAL_TABLET | Freq: Every day | ORAL | 1 refills | Status: DC

## 2019-12-31 NOTE — Progress Notes (Signed)
Patient presents to office today for BP check following vaginal delivery on 9/15. Patient was induced for gestational hypertension and was sent home on Procardia. Patient has been taking her Procardia daily but feels it causes her to have a headache and become dizzy. She stopped taking the Procardia one day and didn't have a headache or dizziness. Patient denies blurry vision. No edema noted. BP 127/79 today. Discussed medication and today's BP with Dr Donavan Foil who states we can switch the patient to Norvasc 5 mg daily instead but patient should monitor her BP at home. Rx placed to pharmacy & patient informed. Reviewed how to check BP at home & when to call the office. Patient verbalized understanding & will return for pp visit on 10/18. Eda Royal assisted with Spanish interpretation.  Chase Caller RN BSN 01/01/20

## 2020-01-01 NOTE — Progress Notes (Signed)
Patient was assessed and managed by nursing staff during this encounter. I have reviewed the chart and agree with the documentation and plan. I have also made any necessary editorial changes.  Warden Fillers, MD 01/01/2020 11:32 AM

## 2020-01-15 ENCOUNTER — Other Ambulatory Visit: Payer: Self-pay | Admitting: General Practice

## 2020-01-15 DIAGNOSIS — O24420 Gestational diabetes mellitus in childbirth, diet controlled: Secondary | ICD-10-CM

## 2020-01-25 ENCOUNTER — Encounter: Payer: Self-pay | Admitting: Family Medicine

## 2020-01-25 ENCOUNTER — Other Ambulatory Visit (HOSPITAL_COMMUNITY)
Admission: RE | Admit: 2020-01-25 | Discharge: 2020-01-25 | Disposition: A | Discharge status: Home / Self Care | Payer: No Typology Code available for payment source | Source: Ambulatory Visit | Attending: Family Medicine | Admitting: Family Medicine

## 2020-01-25 ENCOUNTER — Other Ambulatory Visit: Payer: Self-pay

## 2020-01-25 ENCOUNTER — Other Ambulatory Visit: Payer: No Typology Code available for payment source

## 2020-01-25 ENCOUNTER — Ambulatory Visit (INDEPENDENT_AMBULATORY_CARE_PROVIDER_SITE_OTHER): Payer: No Typology Code available for payment source | Admitting: Family Medicine

## 2020-01-25 VITALS — BP 138/88 | HR 59 | Wt 184.7 lb

## 2020-01-25 DIAGNOSIS — Z789 Other specified health status: Secondary | ICD-10-CM

## 2020-01-25 DIAGNOSIS — Z30013 Encounter for initial prescription of injectable contraceptive: Secondary | ICD-10-CM

## 2020-01-25 DIAGNOSIS — R87613 High grade squamous intraepithelial lesion on cytologic smear of cervix (HGSIL): Secondary | ICD-10-CM | POA: Insufficient documentation

## 2020-01-25 DIAGNOSIS — Z8759 Personal history of other complications of pregnancy, childbirth and the puerperium: Secondary | ICD-10-CM

## 2020-01-25 DIAGNOSIS — N871 Moderate cervical dysplasia: Secondary | ICD-10-CM

## 2020-01-25 DIAGNOSIS — Z3202 Encounter for pregnancy test, result negative: Secondary | ICD-10-CM

## 2020-01-25 DIAGNOSIS — O24420 Gestational diabetes mellitus in childbirth, diet controlled: Secondary | ICD-10-CM

## 2020-01-25 LAB — POCT PREGNANCY, URINE: Preg Test, Ur: NEGATIVE

## 2020-01-25 MED ORDER — MEDROXYPROGESTERONE ACETATE 150 MG/ML IM SUSP
150.0000 mg | Freq: Once | INTRAMUSCULAR | Status: AC
  Administered 2020-01-25: 150 mg via INTRAMUSCULAR

## 2020-01-25 NOTE — Addendum Note (Signed)
Addended by: Maxwell Marion E on: 01/25/2020 11:55 AM   Modules accepted: Orders

## 2020-01-25 NOTE — Progress Notes (Signed)
Post Partum Visit Note  Debbie Strickland is a 39 y.o. [id number] female who presents for a postpartum visit. She is 4 weeks 5 days postpartum following a normal spontaneous vaginal delivery.  I have fully reviewed the prenatal and intrapartum course. The delivery was at 39w 2d.  Anesthesia: epidural. Postpartum course has been normal. Baby is doing well. Baby is feeding by both breast and bottle - Gerber. Bleeding staining only. Bowel function is abnormal: reports recent constipation. BM once daily, hard to pass. Bladder function is normal. Patient is not sexually active. Contraception method is Depo-Provera injections. Postpartum depression screening: negative.    The pregnancy intention screening data noted above was reviewed. Potential methods of contraception were discussed. The patient elected to proceed with Hormonal Injection.      The following portions of the patient's history were reviewed and updated as appropriate: allergies, current medications, past family history, past medical history, past social history, past surgical history and problem list.  Review of Systems Pertinent items noted in HPI and remainder of comprehensive ROS otherwise negative.    Objective:  Weight 184 lb 11.2 oz (83.8 kg), unknown if currently breastfeeding.  General:  alert, cooperative and appears stated age  Lungs: normal effort  Heart:  regular rate and rhythm  Abdomen: soft, non-tender; bowel sounds normal; no masses,  no organomegaly   Vulva:  normal  Vagina: normal vagina  Cervix:  multiparous appearance and no lesions    Colposcopy Procedure: Patient given informed consent, signed copy in the chart, time out was performed.  Placed in lithotomy position. Cervix viewed with speculum and colposcope after application of acetic acid.   Colposcopy adequate?  yes Acetowhite lesions? Entire SCJ Punctation? no Mosaicism?  no Abnormal vasculature?  no Biopsies? 12, 3, 6, 9 o'clock ECC?  Yes  Assessment:    Postpartum exam. Pap smear not done at today's visit.   Plan:   Essential components of care per ACOG recommendations:  1.  Mood and well being: Patient with negative depression screening today. Reviewed local resources for support.  - Patient does not use tobacco.  - hx of drug use? No   2. Infant care and feeding:  -Patient currently breastmilk feeding? Yes If breastmilk feeding discussed return to work and pumping. If needed, patient was provided letter for work to allow for every 2-3 hr pumping breaks, and to be granted a private location to express breastmilk and refrigerated area to store breastmilk. Reviewed importance of draining breast regularly to support lactation.  3. Sexuality, contraception and birth spacing - Patient does not want a pregnancy in the next year.  Desired family size is 3 children.  - Reviewed forms of contraception in tiered fashion. Patient desired Depo-Provera today.   - Discussed birth spacing of 18 months  4. Sleep and fatigue -Encouraged family/partner/community support of 4 hrs of uninterrupted sleep to help with mood and fatigue  5. Physical Recovery  - Discussed patients delivery SVD and no complications - Patient had a small vaginal laceration, perineal healing reviewed. Patient expressed understanding - Patient has urinary incontinence? No - Patient is safe to resume physical and sexual activity  6.  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 For repeat today with ECC. Patient was given post procedure instructions.  She will be called with results Mammogram when not breast feeding.  7. Chronic Disease Continue Amlodipine 2  hour OGT today - PCP follow up  Reva Bores, MD [facility], [facility]

## 2020-01-27 ENCOUNTER — Telehealth: Payer: Self-pay | Admitting: *Deleted

## 2020-01-27 LAB — SURGICAL PATHOLOGY

## 2020-01-27 NOTE — Telephone Encounter (Signed)
I called Debbie Strickland with Interpreter Eda Royal and informed her results and recommendations per Dr. Shawnie Pons. I explained registrar will contact her with an appointment. I explained when she comes for her appointment the provider will decide with her which procedure she will have that day after discussing with her.  She voices understanding. Jancarlo Biermann,RN

## 2020-01-27 NOTE — Telephone Encounter (Signed)
-----   Message from Reva Bores, MD sent at 01/27/2020  1:35 PM EDT ----- Book for LEEP vs. Cryo

## 2020-01-28 ENCOUNTER — Encounter: Payer: Self-pay | Admitting: *Deleted

## 2020-01-29 LAB — GLUCOSE TOLERANCE, 2 HOURS
Glucose, 2 hour: 137 mg/dL (ref 65–139)
Glucose, GTT - Fasting: 83 mg/dL (ref 65–99)

## 2020-03-16 ENCOUNTER — Ambulatory Visit (INDEPENDENT_AMBULATORY_CARE_PROVIDER_SITE_OTHER): Payer: No Typology Code available for payment source | Admitting: Family Medicine

## 2020-03-16 ENCOUNTER — Encounter: Payer: Self-pay | Admitting: Family Medicine

## 2020-03-16 ENCOUNTER — Other Ambulatory Visit: Payer: Self-pay

## 2020-03-16 DIAGNOSIS — Z3202 Encounter for pregnancy test, result negative: Secondary | ICD-10-CM

## 2020-03-16 DIAGNOSIS — N871 Moderate cervical dysplasia: Secondary | ICD-10-CM

## 2020-03-16 LAB — POCT PREGNANCY, URINE: Preg Test, Ur: NEGATIVE

## 2020-03-16 NOTE — Progress Notes (Signed)
    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  The indications for cryotherapy were reviewed with the patient in detail. She was counseled about that efficacy of this procedure, and possible need for excisional procedure in the future if her cervical dysplasia persists.  The risks of the procedure where explained in detail and patient was told to expect a copious amount of discharge in the next few weeks. All her questions were answered, and written informed consent was obtained.  The patient was placed in the dorsal lithotomy position and a vaginal speculum was placed. Her cervix was visualized and patient was noted to have had normal size transformation zone. The appropriate cryotherapy probe was picked and affixed to cryotherapy apparatus. Then nitrogen gas was then activated, the probe was coated with lubricating jelly and applied to the transformation zone of the cervix. This was kept in place for 3 minutes. The cryotherapy was then stopped and all instruments were removed from the patient's pelvis; a thawing period of 3 minutes was observed.  A second cycle of cryotherapy was then administered to the cervix for 3 minutes.  The patient tolerated the procedure well without any complications. Routine post procedure instructions were given to the patient.  Will repeat pap smear in 12 months and manage accordingly.  Reva Bores, MD 03/16/2020 4:59 PM

## 2020-05-16 ENCOUNTER — Other Ambulatory Visit: Payer: Self-pay | Admitting: Obstetrics and Gynecology

## 2020-05-16 DIAGNOSIS — Z1231 Encounter for screening mammogram for malignant neoplasm of breast: Secondary | ICD-10-CM

## 2020-06-23 ENCOUNTER — Ambulatory Visit: Payer: No Typology Code available for payment source

## 2020-06-23 ENCOUNTER — Encounter: Payer: Self-pay | Admitting: *Deleted

## 2020-06-23 ENCOUNTER — Other Ambulatory Visit: Payer: Self-pay

## 2020-06-23 NOTE — Progress Notes (Signed)
Patient presented to [facility] clinic for baseline screening mammogram. Patient is currently breastfeeding and is unsure when she will start weaning. Informed patient that the recommendation is to wait 6 months after stopping breastfeeding before having screening mammogram. Informed patient to give [place] a call back at that point and we will reschedule her appointment. Patient voiced understanding.

## 2020-07-11 ENCOUNTER — Encounter: Payer: Self-pay | Admitting: General Practice

## 2020-12-06 IMAGING — US US MFM OB FOLLOW-UP
1 series · 14 of 28 positions shown · non-contrast
Comparison: none

[Series 1: us mfm ob follow-up · 33 acquisitions, 14 frames shown]
[im 2/33]
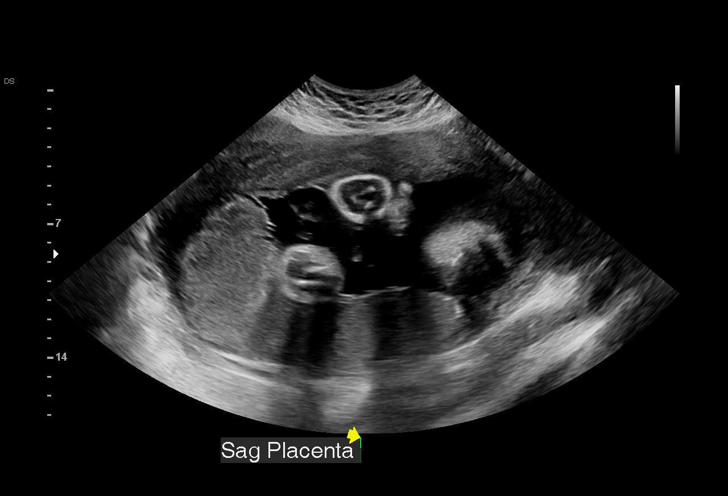
[im 4/33]
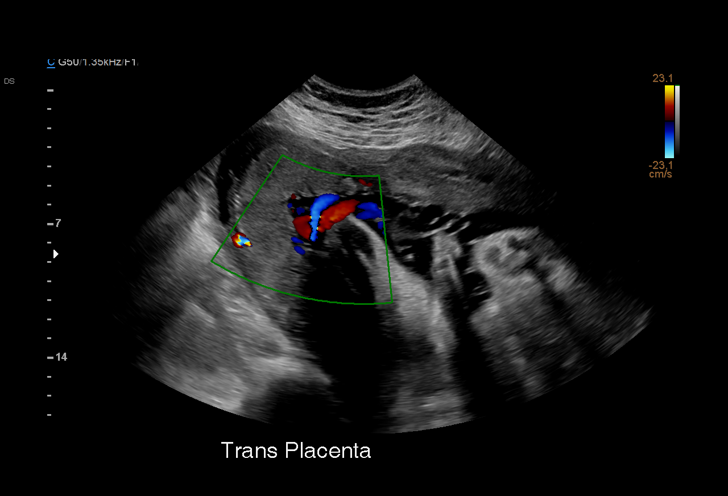
[im 6/33]
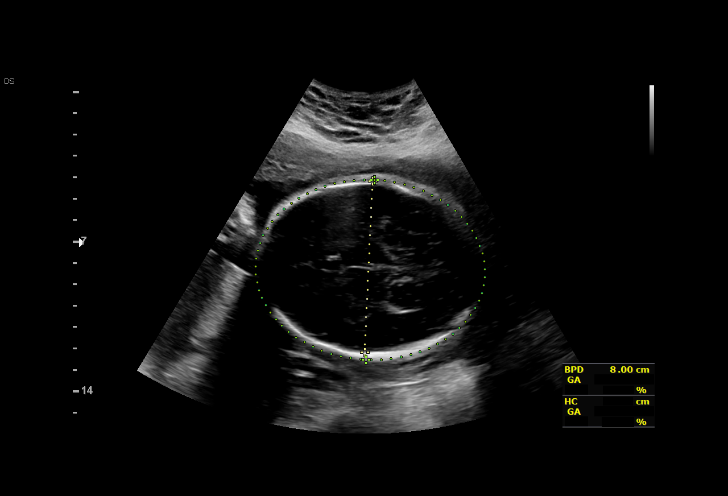
[im 9/33]
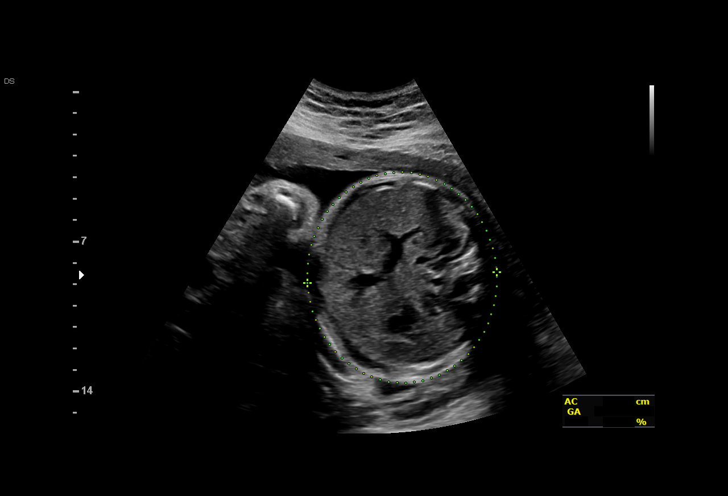
[im 11/33]
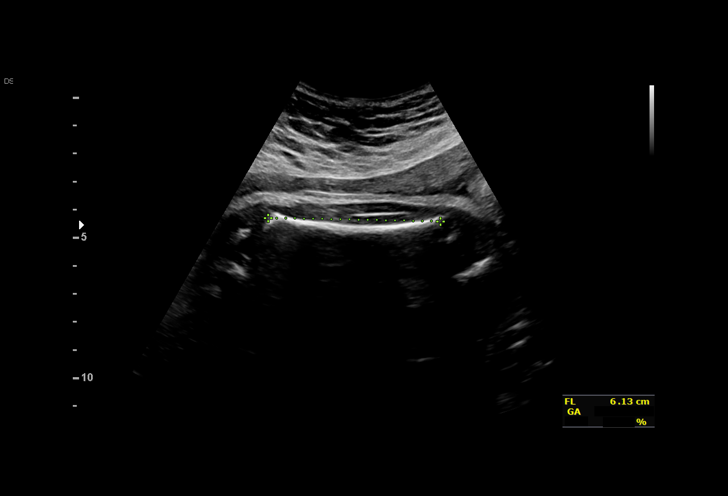
[im 14/33]
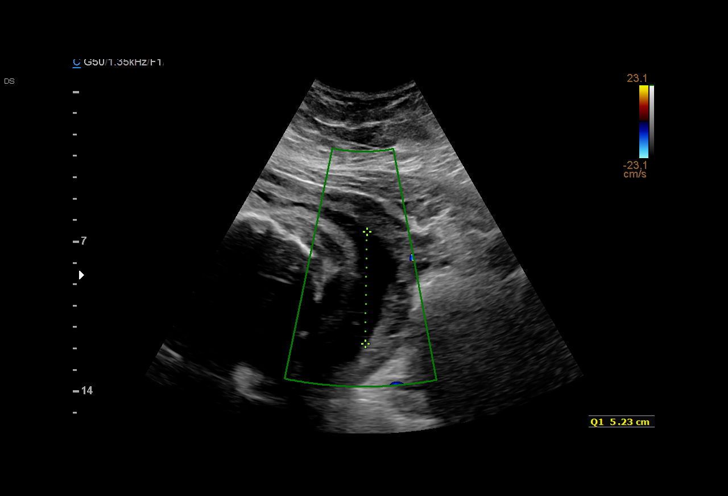
[im 16/33]
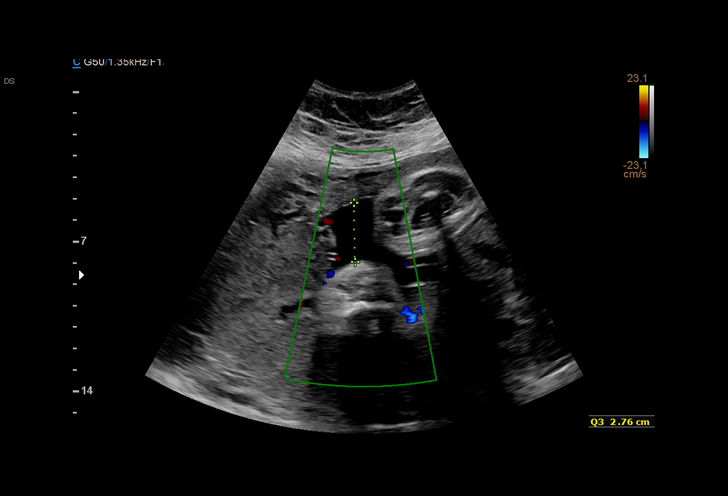
[im 18/33]
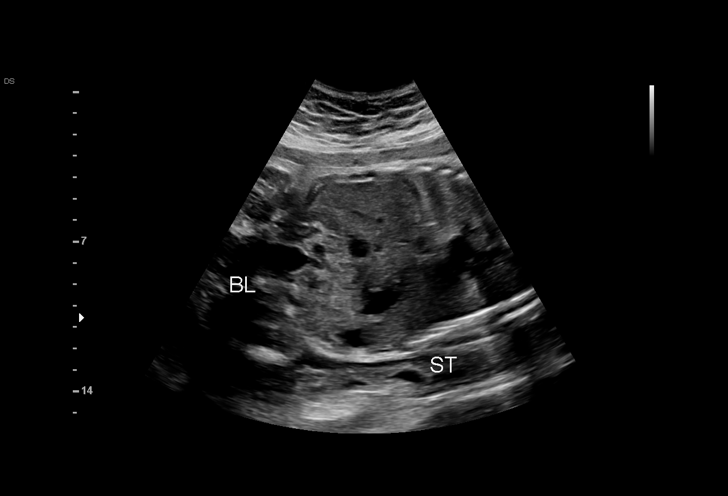
[im 21/33]
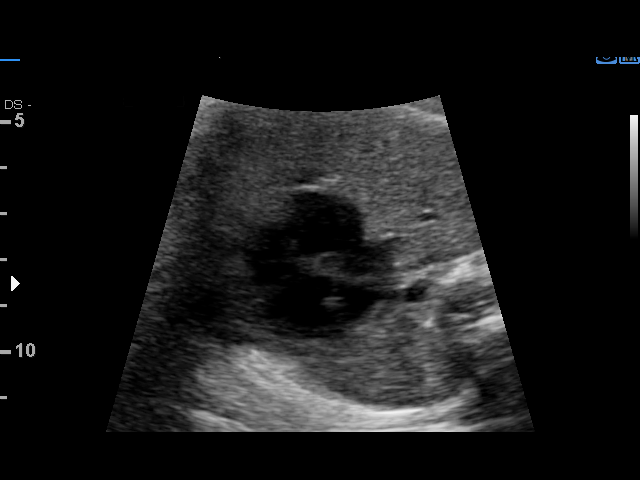
[im 23/33]
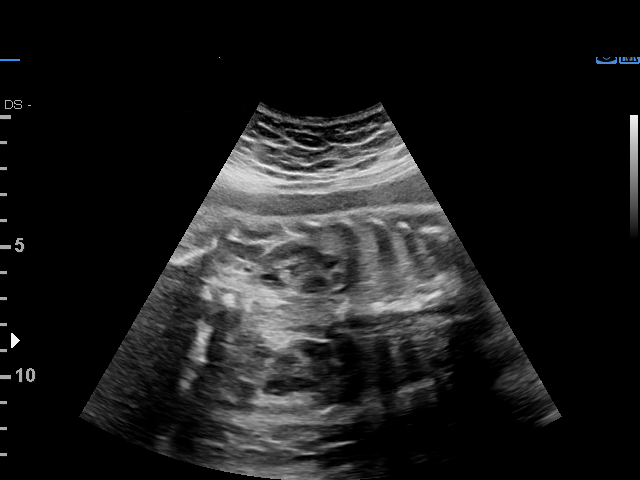
[im 25/33]
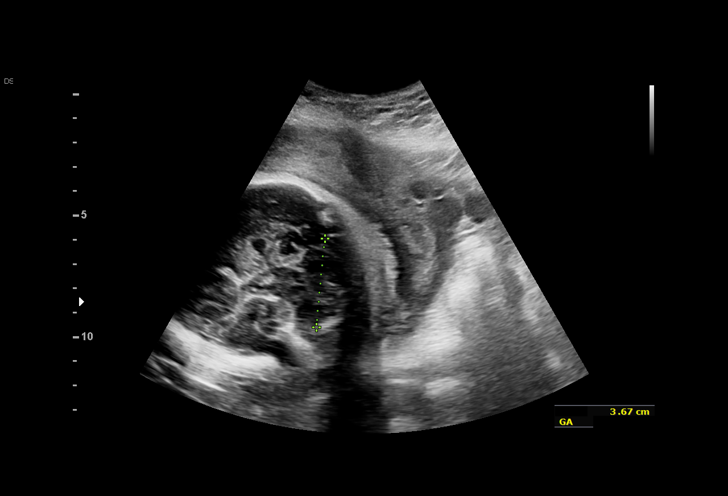
[im 28/33]
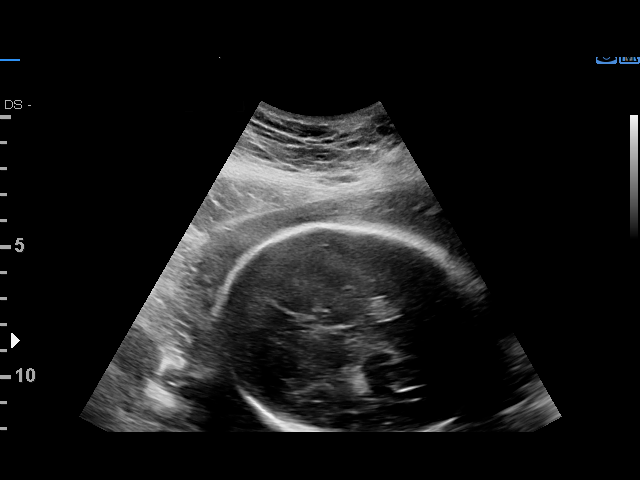
[im 30/33]
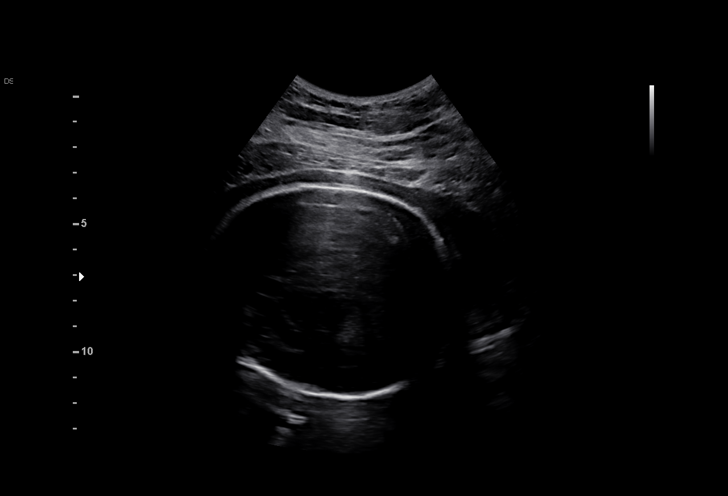
[im 33/33]
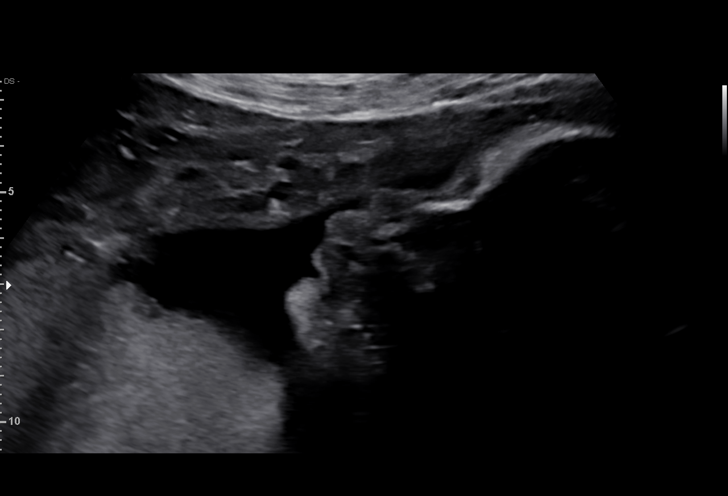

[14 of 28 positions shown; findings below may reference images not displayed]

[REDACTED]

Indications

 Gestational diabetes in pregnancy, diet
 controlled
 Obesity complicating pregnancy, second
 trimester
 Advanced maternal age multigravida 35+,
 second trimester
 Low risk (panorama, 43M.W %, fem)
 Encounter for other antenatal screening
 follow-up
 32 weeks gestation of pregnancy
Vital Signs

                                                Height:        4'11"
Fetal Evaluation

 Num Of Fetuses:         1
 Cardiac Activity:       Observed
 Presentation:           Cephalic
 Placenta:               Right Fundal
 P. Cord Insertion:      Visualized, central

 Amniotic Fluid
 AFI FV:      Within normal limits

 AFI Sum(cm)     %Tile       Largest Pocket(cm)
 12.98           40
 RUQ(cm)       RLQ(cm)       LUQ(cm)        LLQ(cm)
 5.23          4.99          0
Biometry

 BPD:      80.2  mm     G. Age:  32w 1d         31  %    CI:        71.71   %    70 - 86
                                                         FL/HC:      20.5   %    19.9 -
 HC:      301.5  mm     G. Age:  33w 3d         35  %    HC/AC:      1.01        0.96 -
 AC:      297.7  mm     G. Age:  33w 5d         82  %    FL/BPD:     77.1   %    71 - 87
 FL:       61.8  mm     G. Age:  32w 0d         24  %    FL/AC:      20.8   %    20 - 24

 Est. FW:    1225  gm    4 lb 11 oz      57  %
OB History

 Gravidity:    3         Term:   2
 Living:       2
Gestational Age

 LMP:           32w 4d        Date:  03/23/19                 EDD:   12/28/19
 U/S Today:     32w 6d                                        EDD:   12/26/19
 Best:          32w 4d     Det. By:  LMP  (03/23/19)          EDD:   12/28/19
Anatomy

 Ventricles:            Appears normal         Stomach:                Appears normal, left
                                                                       sided
 Choroid Plexus:        Appears normal         Kidneys:                Appear normal
 Cerebellum:            Not well visualized    Bladder:                Appears normal
 Heart:                 Appears normal
                        (4CH, axis, and
                        situs)

 Other:  Single live IUP. Other anatomy previously imaged.
Cervix Uterus Adnexa

 Cervix
 Not visualized (advanced GA >67wks)
Comments

 This patient was seen for a follow up growth scan due to
 maternal obesity and recently diagnosed diet-controlled
 gestational diabetes.
 She was informed that the fetal growth and amniotic fluid
 level appears appropriate for her gestational age.
 A follow up exam was scheduled in 4 weeks.
 She should be referred back for weekly fetal testing should
 she require insulin or Metformin for treatment of her
 gestational diabetes.

## 2021-01-03 IMAGING — US US MFM OB FOLLOW-UP
1 series · 14 of 28 positions shown · non-contrast
Comparison: none

[Series 1: us mfm ob follow-up · 42 acquisitions, 14 frames shown]
[im 2/42]
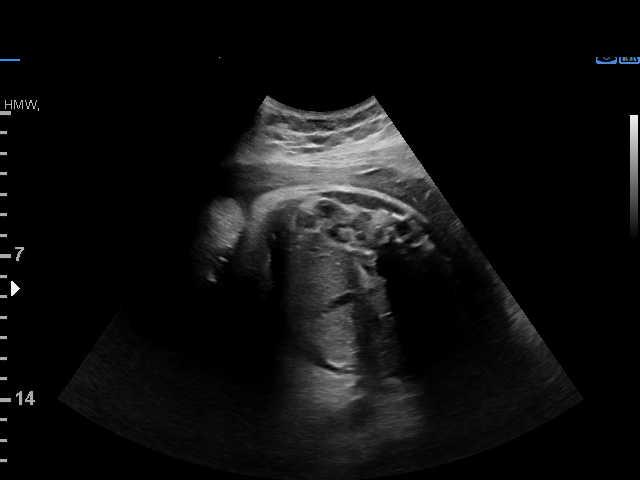
[im 5/42]
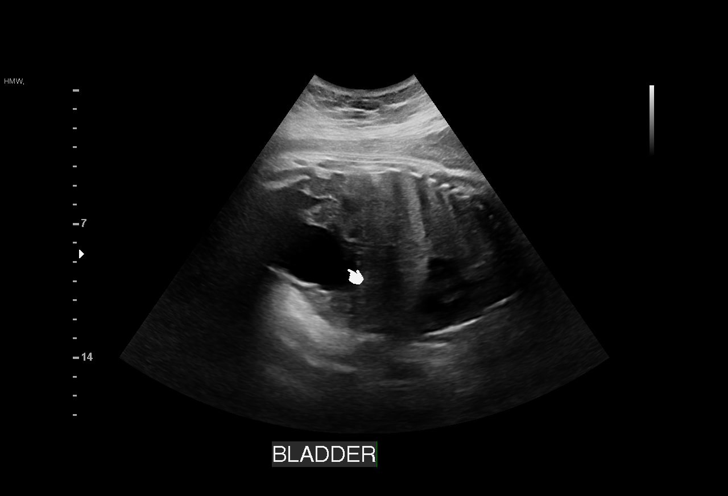
[im 8/42]
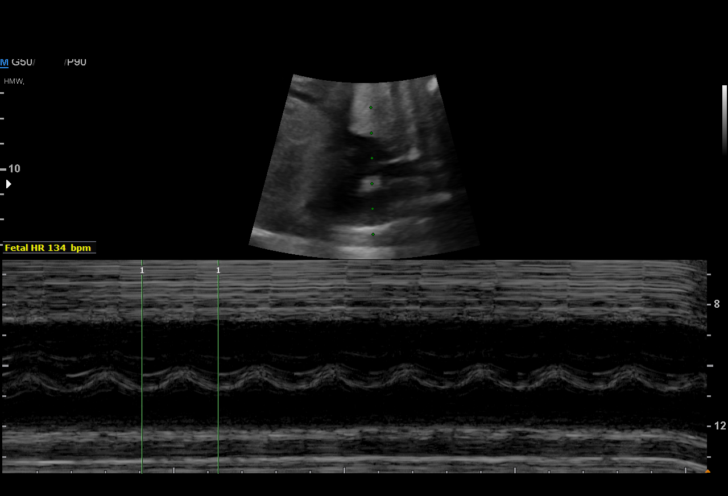
[im 11/42]
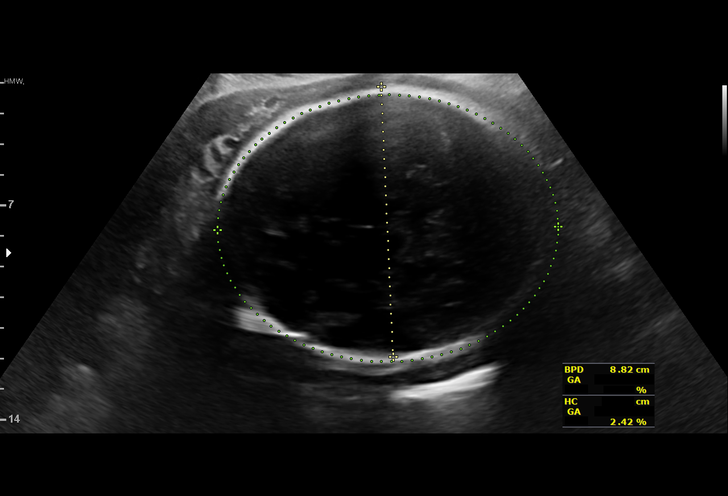
[im 14/42]
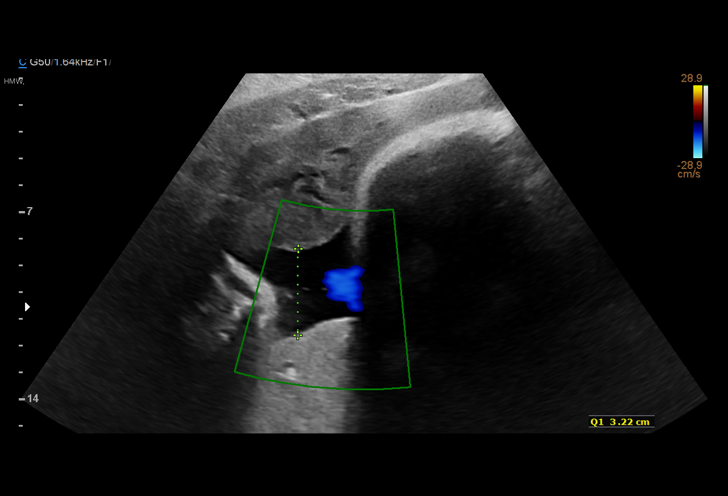
[im 17/42]
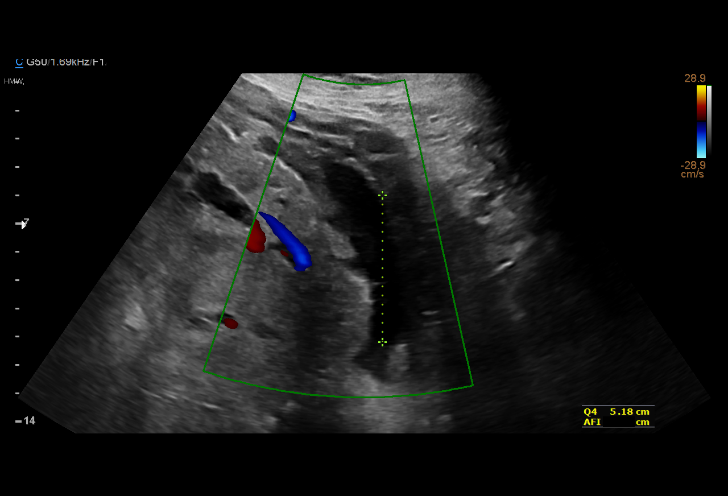
[im 20/42]
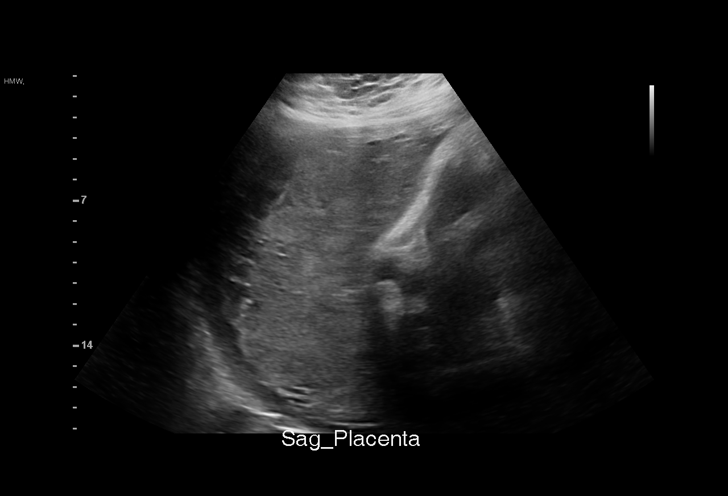
[im 23/42]
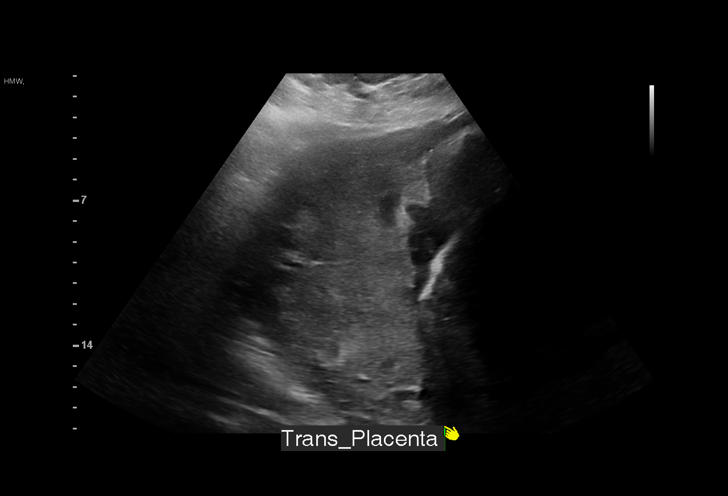
[im 26/42]
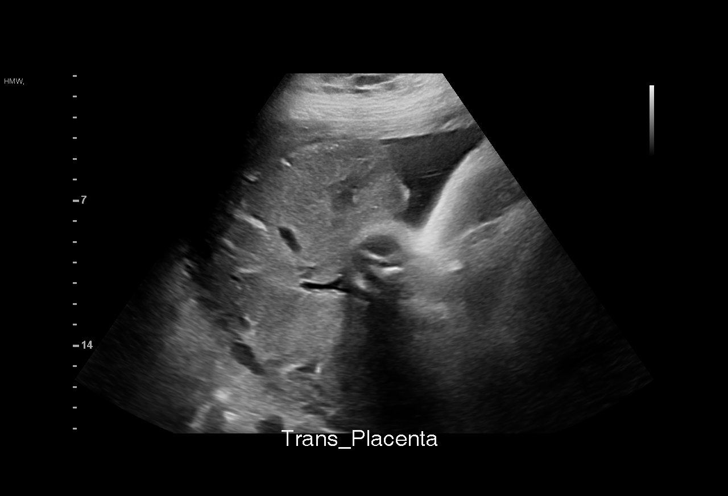
[im 29/42]
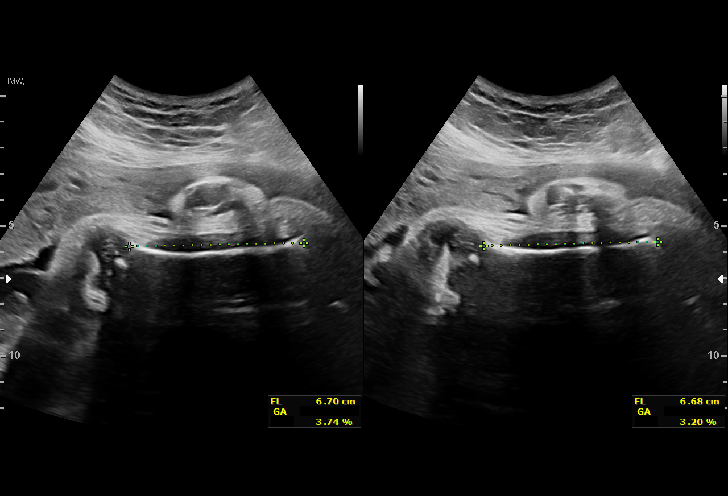
[im 32/42]
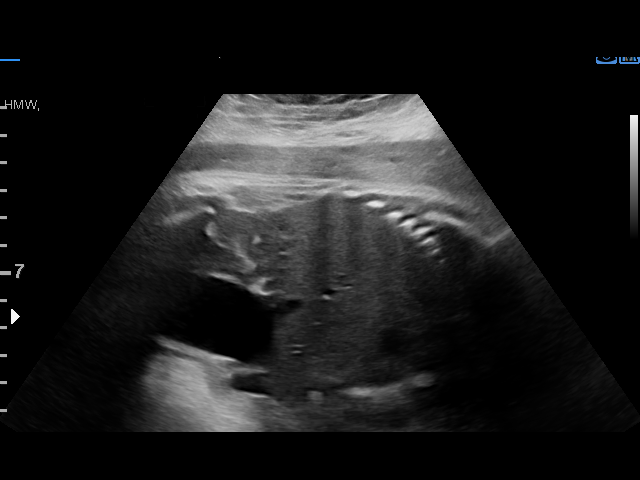
[im 35/42]
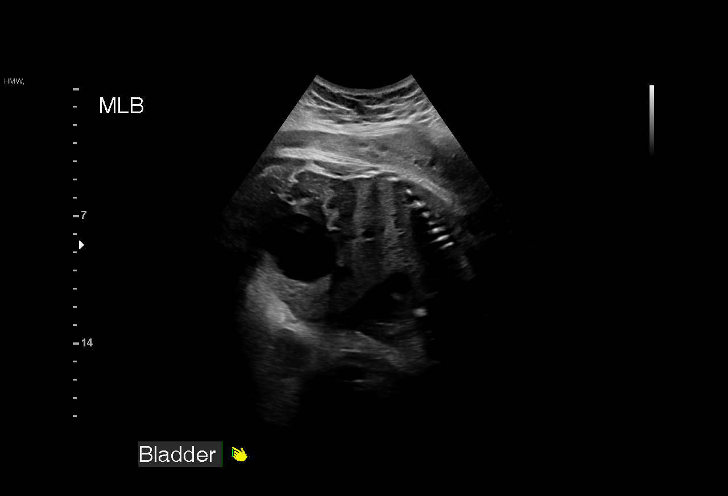
[im 38/42]
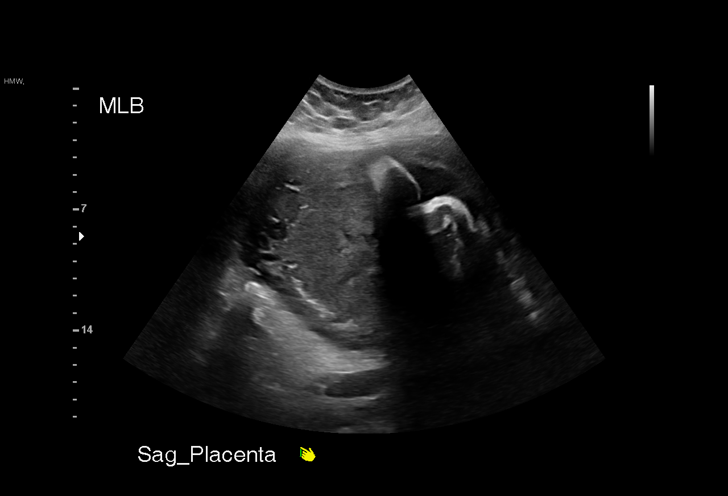
[im 42/42]
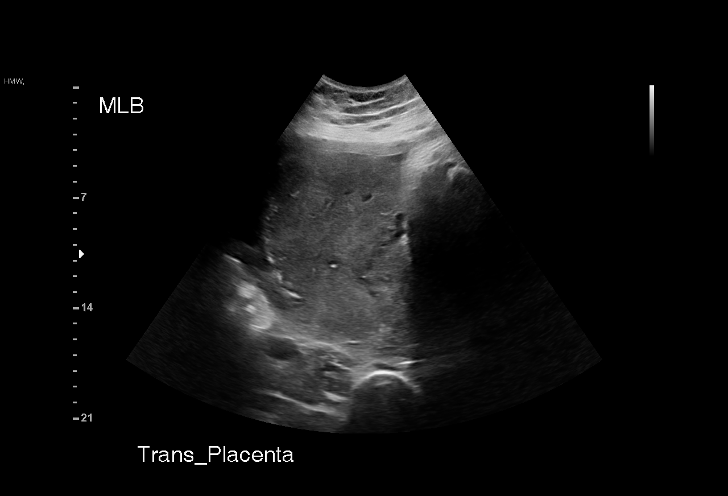

[14 of 28 positions shown; findings below may reference images not displayed]

[REDACTED]

Indications

 Gestational diabetes in pregnancy, diet
 controlled
 36 weeks gestation of pregnancy
 Low risk (panorama, IIK.T %, fem)
 Encounter for other antenatal screening
 follow-up
 Obesity complicating pregnancy, third
 trimester
 Advanced maternal age multigravida 35+,
 third trimester
Vital Signs

                                                Height:        4'11"
Fetal Evaluation

 Num Of Fetuses:         1
 Fetal Heart Rate(bpm):  134
 Cardiac Activity:       Observed
 Presentation:           Cephalic
 Placenta:               Posterior
 P. Cord Insertion:      Previously Visualized
 Amniotic Fluid
 AFI FV:      Within normal limits

 AFI Sum(cm)     %Tile       Largest Pocket(cm)
 15.31           57

 RUQ(cm)       RLQ(cm)       LUQ(cm)        LLQ(cm)

Biometry

 BPD:      88.1  mm     G. Age:  35w 4d         34  %    CI:        78.87   %    70 - 86
                                                         FL/HC:      21.3   %    20.8 -
 HC:      313.7  mm     G. Age:  35w 1d        4.1  %    HC/AC:      0.94        0.92 -
 AC:      332.7  mm     G. Age:  37w 1d         78  %    FL/BPD:     75.7   %    71 - 87
 FL:       66.7  mm     G. Age:  34w 2d          5  %    FL/AC:      20.0   %    20 - 24

 Est. FW:    6438  gm      6 lb 4 oz     40  %
OB History

 Gravidity:    3         Term:   2
 Living:       2
Gestational Age

 LMP:           36w 4d        Date:  03/23/19                 EDD:   12/28/19
 U/S Today:     35w 4d                                        EDD:   01/04/20
 Best:          36w 4d     Det. By:  LMP  (03/23/19)          EDD:   12/28/19
Anatomy

 Cranium:               Appears normal         Aortic Arch:            Previously seen
 Cavum:                 Previously seen        Ductal Arch:            Previously seen
 Ventricles:            Appears normal         Diaphragm:              Previously seen
 Choroid Plexus:        Previously seen        Stomach:                Appears normal, left
                                                                       sided
 Cerebellum:            Previously seen        Abdomen:                Appears normal
 Posterior Fossa:       Previously seen        Abdominal Wall:         Previously seen
 Nuchal Fold:           Not applicable (>20    Cord Vessels:           Previously seen
                        wks GA)
 Face:                  Orbits and profile     Kidneys:                Previously seen
                        previously seen
 Lips:                  Previously seen        Bladder:                Appears normal
 Thoracic:              Appears normal         Spine:                  Previously seen
 Heart:                 Previously seen        Upper Extremities:      Previously seen
 RVOT:                  Previously seen        Lower Extremities:      Previously seen
 LVOT:                  Previously seen

 Other:  Fetus appears to be female. Technically difficult due to maternal
         habitus and fetal position. Heels not visualized.
Cervix Uterus Adnexa

 Cervix
 Not visualized (advanced GA >49wks)
Impression

 Follow up growth given [id number]- Blood sugars reviewed
 <95% within normal limits
 Normal interval growth with measurements consistent witht
 dates
 There is good fetal movement and amniotic fluid volume.
Recommendations

 Follow up as clinically indicated.

## 2021-05-30 ENCOUNTER — Other Ambulatory Visit: Payer: Self-pay

## 2021-05-30 ENCOUNTER — Ambulatory Visit (INDEPENDENT_AMBULATORY_CARE_PROVIDER_SITE_OTHER): Payer: No Typology Code available for payment source | Admitting: Nurse Practitioner

## 2021-05-30 VITALS — Wt 204.0 lb

## 2021-05-30 DIAGNOSIS — R87613 High grade squamous intraepithelial lesion on cytologic smear of cervix (HGSIL): Secondary | ICD-10-CM

## 2021-05-30 DIAGNOSIS — Z789 Other specified health status: Secondary | ICD-10-CM

## 2021-05-30 NOTE — Progress Notes (Signed)
Patient can be better served at [facility] with no cost to her for the testing that she needs. Informed of new appointment.  Nolene Bernheim, RN, MSN, NP-BC Nurse Practitioner, [facility] for [employer], [facility] 05/30/2021 10:21 AM

## 2021-05-30 NOTE — Progress Notes (Signed)
Patient came in for a repeat pap smear from a cryotherapy procedure on 03/2021. Spoke to [employer], [facility] scheduled patient a cervical cancer screening appointment on 08/01/2021 @1045 .  Patient is aware of date and time. Spanish Raquel interpreter assist with the visit.   , Felecia Shelling 05/30/2021 1011

## 2021-08-01 ENCOUNTER — Other Ambulatory Visit: Payer: Self-pay | Admitting: *Deleted

## 2021-08-01 DIAGNOSIS — Z124 Encounter for screening for malignant neoplasm of cervix: Secondary | ICD-10-CM

## 2021-08-01 NOTE — Progress Notes (Signed)
Patient: Debbie Strickland           ?Date of Birth: September 17, 1980           ?MRN: [id number] ?Visit Date: 08/01/2021 ?PCP: Patient, No Pcp Per (Inactive) ? ?Cervical Cancer Screening ?Do you smoke?: No ?Have you ever had or been told you have an allergy to latex products?: No ?Marital status: Married ?Date of last pap smear: 1-2 yrs ago (06/25/2019 HSIL/HPV-Negative [facility])) ?Date of last menstrual period:  (No menses since 09/2020) ?Number of pregnancies: 3 ?Number of births: 3 ?Have you ever had any of the following? ?Hysterectomy: No ?Tubal ligation (tubes tied): No ?Abnormal bleeding: Yes (08/2020, 2018) ?Abnormal pap smear: Yes (See History) ?Venereal warts: No ?A sex partner with venereal warts: No ?A high risk* sex partner: No ? ?Cervical Exam ? ?Abnormal Observations: White frothy discharge observed in vagina and on cervix. Wet prep completed. ?Recommendations: Last Pap smear was 06/25/2019 at [facility] and HSIL with negative HPV. Patient had a colposcopy completed for follow up 07/30/2019 that showed CIN II-III and a repeat colposcopy 01/25/2020 due to was pregnant during previous colposcopy that showed CIN I-II. Patient had cryotherapy completed 03/16/2020 for follow up for abnormal colposcopy. Let patient know that if today's Pap smear is normal and HPV negative that her next Pap smear will be due in one year due to her previous Pap smear was abnormal. Informed patient that will follow up with her within the next week with results of Wet prep and Pap smear by phone. Patient verbalized understanding. ? ? ?Spanish interpreter Natale Lay from [facility] provided. ? ?Patient's History ?Patient Active Problem List  ? Diagnosis Date Noted  ? History of pregnancy induced hypertension 12/21/2019  ? History of gestational diabetes 10/10/2019  ? BMI 40.0-44.9, adult (HCC) 08/12/2019  ? Language barrier 08/12/2019  ? 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 ?Past Medical  History:  ?Diagnosis Date  ? Abnormal uterine bleeding 10/13/2014  ? DUB (dysfunctional uterine bleeding)   ? Gestational diabetes   ? Pregnancy induced hypertension   ?  ?Family History  ?Problem Relation Age of Onset  ? Diabetes Mother   ? Hypertension Mother   ? Heart disease Mother   ? Lung cancer Father   ?  ?Social History  ? ?Occupational History  ? Not on file  ?Tobacco Use  ? Smoking status: Never  ? Smokeless tobacco: Never  ?Vaping Use  ? Vaping Use: Never used  ?Substance and Sexual Activity  ? Alcohol use: Not Currently  ?  Comment: rarely  ? Drug use: No  ? Sexual activity: Yes  ?  Birth control/protection: None  ? ?

## 2021-08-01 NOTE — Addendum Note (Signed)
Addended by: Demetrius Revel on: 08/01/2021 11:17 AM ? ? Modules accepted: Orders ? ?

## 2021-08-02 LAB — CERVICOVAGINAL ANCILLARY ONLY
Bacterial Vaginitis (gardnerella): NEGATIVE
Candida Glabrata: NEGATIVE
Candida Vaginitis: NEGATIVE
Comment: NEGATIVE
Comment: NEGATIVE
Comment: NEGATIVE
Comment: NEGATIVE
Trichomonas: NEGATIVE

## 2021-08-04 LAB — CYTOLOGY - PAP
Comment: NEGATIVE
Diagnosis: NEGATIVE
High risk HPV: NEGATIVE

## 2021-08-07 ENCOUNTER — Telehealth: Payer: Self-pay

## 2021-08-07 NOTE — Telephone Encounter (Signed)
Via Delorise Royals, Spanish Interpreter, Patient informed negative Pap/HPV and Wet prep results. Repeat pap smear in 1 year. Patient verbalized understanding.  ?

## 2021-08-22 ENCOUNTER — Telehealth: Payer: Self-pay

## 2021-08-22 NOTE — Telephone Encounter (Signed)
Via [employer], Interpreter # [phone], Patient was contacted as she called and requested a return call. Patient stated that when she had Cryotherapy, she was told that there was lumps found on her uterus, was expecting a call. Cryotherapy was done in 2021. Patient was informed negative Pap/HPV, wet prep results again, and to contact Dr. Tawni Levy office [facility]), contact number provided per Wardell Heath, [facility]CP scheduler.  ?

## 2021-10-27 ENCOUNTER — Ambulatory Visit: Payer: No Typology Code available for payment source | Admitting: Certified Nurse Midwife

## 2021-10-30 ENCOUNTER — Telehealth: Payer: Self-pay | Admitting: Family Medicine

## 2021-10-30 NOTE — Telephone Encounter (Signed)
Patient come to the front desk with an interpreter requesting another vaginal ultrasound following her leep she had in the past. She said she wants to confirm everything is still going well with her situation.

## 2021-10-30 NOTE — Telephone Encounter (Signed)
Called pt with Spanish Interpreter Byrd Hesselbach pt asks if she still has fibroids.  I explained to the pt that unless she has had fibroids removed or have a hysterectomy she will always have fibroids.  Pt denies any concerns today.  Pt advised that if has heavy bleeding or pelvic pain to please call and schedule an appt.  Pt verbalized understanding.   Leonette Nutting  10/30/21

## 2022-06-25 ENCOUNTER — Other Ambulatory Visit: Payer: Self-pay

## 2022-06-25 ENCOUNTER — Ambulatory Visit: Payer: No Typology Code available for payment source | Admitting: Obstetrics and Gynecology

## 2022-08-22 ENCOUNTER — Other Ambulatory Visit: Payer: Self-pay | Admitting: Obstetrics and Gynecology

## 2022-08-22 DIAGNOSIS — Z1231 Encounter for screening mammogram for malignant neoplasm of breast: Secondary | ICD-10-CM

## 2022-08-30 ENCOUNTER — Ambulatory Visit: Payer: Self-pay | Admitting: Hematology and Oncology

## 2022-08-30 ENCOUNTER — Ambulatory Visit
Admission: RE | Admit: 2022-08-30 | Discharge: 2022-08-30 | Disposition: A | Discharge status: Home / Self Care | Payer: No Typology Code available for payment source | Source: Ambulatory Visit | Attending: Obstetrics and Gynecology | Admitting: Obstetrics and Gynecology

## 2022-08-30 VITALS — BP 139/74 | Wt 210.0 lb

## 2022-08-30 DIAGNOSIS — Z1231 Encounter for screening mammogram for malignant neoplasm of breast: Secondary | ICD-10-CM

## 2022-08-30 DIAGNOSIS — Z01419 Encounter for gynecological examination (general) (routine) without abnormal findings: Secondary | ICD-10-CM

## 2022-08-30 NOTE — Patient Instructions (Signed)
Taught Louis Matte about self breast awareness and gave educational materials to take home. Patient did need a Pap smear today due to last Pap smear was in 08/01/21 per patient.  Let her know [facility] will cover Pap smears every 5 years unless has a history of abnormal Pap smears. Referred patient to the [facility] for screening mammogram. Appointment scheduled for 08/30/22. Patient aware of appointment and will be there. Let patient know will follow up with her within the next couple weeks with results. Berenice Bouton Gonzalez-Rico verbalized understanding.  Pascal Lux, NP 10:54 AM

## 2022-08-30 NOTE — Progress Notes (Signed)
Ms. Debbie Strickland is a 42 y.o. [id number] female who presents to [facility] clinic today with no complaints.    Pap Smear: Pap smear completed today. Last Pap smear was 08/01/2021 and was normal. Per patient has history of an abnormal Pap smear. Last Pap smear result is available in Epic.06/25/2019 - HSIL/ HPV-; 07/30/19 - colposcopy - HSIL; 01/25/20 - colposcopy - LGSIL. Patient reports having cryotherapy.   Physical exam: Breasts Breasts symmetrical. No skin abnormalities bilateral breasts. No nipple retraction bilateral breasts. No nipple discharge bilateral breasts. No lymphadenopathy. No lumps palpated bilateral breasts.       Pelvic/Bimanual Ext Genitalia No lesions, no swelling and no discharge observed on external genitalia.        Vagina Vagina pink and normal texture. No lesions or discharge observed in vagina.        Cervix Cervix is present. Cervix pink and of normal texture. No discharge observed.    Uterus Uterus is present and palpable. Uterus in normal position and normal size.        Adnexae Bilateral ovaries present and palpable. No tenderness on palpation.         Rectovaginal No rectal exam completed today since patient had no rectal complaints. No skin abnormalities observed on exam.     Smoking History: Patient has never smoked and was not referred to quit line.    Patient Navigation: Patient education provided. Access to services provided for patient through B[facility]CP program. Kathyrn Lass interpreter provided. No transportation provided   Colorectal Cancer Screening: Per patient has never had colonoscopy completed No complaints today.    Breast and Cervical Cancer Risk Assessment: Patient does not have family history of breast cancer, known genetic mutations, or radiation treatment to the chest before age 30. Patient has history of cervical dysplasia, immunocompromised, or DES exposure in-utero.  Risk Scores as of 08/30/2022     Debbie Strickland           5-year  0.65 %   Lifetime 9.7 %   This patient is [place]/Latina but has no documented birth country, so the [place] model used data from [place] patients to calculate their risk score. Document a birth country in the Demographics activity for a more accurate score.         Last calculated by Caprice Red, CMA on 08/30/2022 at 10:35 AM        A: B[facility]CP exam with pap smear No complaints with benign exam.   P: Referred patient to the [facility] for a screening mammogram. Appointment scheduled 08/30/22.  Pascal Lux, NP 08/30/2022 10:51 AM

## 2022-09-05 ENCOUNTER — Other Ambulatory Visit: Payer: Self-pay | Admitting: Obstetrics and Gynecology

## 2022-09-05 DIAGNOSIS — R928 Other abnormal and inconclusive findings on diagnostic imaging of breast: Secondary | ICD-10-CM

## 2022-09-05 NOTE — Progress Notes (Signed)
Opened in error. Scheduled patient for annual BCCCP appointment. 

## 2022-09-10 LAB — CYTOLOGY - PAP
Adequacy: ABSENT
Comment: NEGATIVE
Diagnosis: NEGATIVE
High risk HPV: NEGATIVE

## 2022-09-11 ENCOUNTER — Other Ambulatory Visit: Payer: Self-pay

## 2022-09-11 ENCOUNTER — Inpatient Hospital Stay: Payer: No Typology Code available for payment source | Attending: Obstetrics and Gynecology | Admitting: *Deleted

## 2022-09-11 VITALS — BP 130/85 | Ht 61.5 in | Wt 213.6 lb

## 2022-09-11 DIAGNOSIS — Z0000 Encounter for general adult medical examination without abnormal findings: Secondary | ICD-10-CM

## 2022-09-11 NOTE — Progress Notes (Signed)
[facility] initial screening   Interpreter- Natale Lay, [place]   Clinical Measurement:  Vitals:   09/11/22 0958 09/11/22 1356  BP: (!) 128/99 130/85   Fasting Labs Drawn Today, will review with patient when they result.   Medical History: Patient states that she does not have high cholesterol, has high blood pressure and she does not have diabetes.  Medications: Patient states that she does not take medication to lower cholesterol, blood pressure or blood sugar.  Patient does not take an aspirin a day to help prevent a heart attack or stroke.    Blood pressure, self measurement: Patient states that she does not measure blood pressure from home. She checks her blood pressure N/A. She shares her readings with a health care provider: N/A.   Nutrition: Patient states that on average she eats 1 cups of fruit and 0 cups of vegetables per day. Patient states that she does not eat fish at least 2 times per week. Patient eats less than half servings of whole grains. Patient drinks less than 36 ounces of beverages with added sugar weekly: yes. Patient is currently watching sodium or salt intake: yes. In the past 7 days patient has consumed drinks containing alcohol on 1 days. On a day that patient consumes drinks containing alcohol on average 1 drinks are consumed.      Physical activity: Patient states that she gets 90 minutes of moderate and 0 minutes of vigorous physical activity each week.  Smoking status: Patient states that she has has never smoked .   Quality of life: Over the past 2 weeks patient states that she had little interest or pleasure in doing things: not at all. She has been feeling down, depressed or hopeless:not at all.   Social Determinants of Health Assessment:   Computer Use: During the last 12 months patient states that she has used any of the following: desktop/laptop, smart phone or tablet/other portable wireless computer: yes.   Internet Use: During the last 12 months,  did you or any member of your household have access to the internet: Yes, by paying a cell phone company or internet service provider.   Food Insecurities: During the last 12 months, where there any times when you were worried that you would run out of food because of a lack of money or other resources: No.   Transportation Barriers: During the last 12 months, have you missed a doctor's appointment because of transportation problems: No.   Childcare Barriers: If you are currently using childcare services, please identify  the type of services you use. (If not using childcare services, please select "Not applicable"): not applicable. During the last 12 months, have you had any barriers to childcare services such as: not applicable.   Housing: What is your housing situation today: I have housing.   Intimate Partner Violence: During the last 12 months, how often did your partner physically hurt you: never. During the last 12 months, how often did your partner insult you or talk down to you: never.  Medication Adherence: During the last 12 months, did you ever forget to take your medicine: not applicable. During the last 12 months, were you careless ar times about taking your medicine: not applicable. During the last 12 months, when you felt better did you sometimes stop taking your medication: not applicable. During the last 12 months, sometimes if you felt worse when you took your medicine did you stop taking it: not applicable.   Risk reduction and counseling:  Health Coaching: Spoke with patient about the daily recommendation for fruits (2 c) and (3 c) vegetables. Showed patient what a serving size would look like. Patient does not consume fish often. Spoke with patient about heart healthy fish that she can add into diet (salmon, tuna, mackerel, sardines, sea bass or trout). Patient consumes whole grain cereal at times but not often. Spoke with patient about adding other whole grains into diet  (whole wheat bread, oatmeal, brown rice and whole wheat pasta). Patient does not consume sodas often or beverages with added sugars. Patient has been walking 3-4 times per week for 30 minutes. Gave patient a portion size container to use at home to help with portion control as well as a my plate. Patient has hx of hypertension during her last pregnancy. BP was elevated during today's screening. Patient has a BP monitor at home that she was given during her last pregnancy. Went over with patient the proper way to check BP at home and how to use monitor. Gave patient a BP tracking card to take home with her. Encouraged patient to start tracking her BP daily at home and to take her readings with her to her FU appointment.   Goal: Patient would like to work on improving her diet. Patient would like to work on cooking at home and preparing healthier meals at home instead of eating out. A specific SMART goal was not set as far as time and being measurable.   Navigation:  I will notify patient of lab results.  Patient is aware of 2 more health coaching sessions and a follow up.  Time: 30 minutes

## 2022-09-13 ENCOUNTER — Other Ambulatory Visit: Payer: Self-pay | Admitting: Obstetrics and Gynecology

## 2022-09-13 ENCOUNTER — Ambulatory Visit
Admission: RE | Admit: 2022-09-13 | Discharge: 2022-09-13 | Disposition: A | Discharge status: Home / Self Care | Payer: No Typology Code available for payment source | Source: Ambulatory Visit | Attending: Obstetrics and Gynecology | Admitting: Obstetrics and Gynecology

## 2022-09-13 DIAGNOSIS — R928 Other abnormal and inconclusive findings on diagnostic imaging of breast: Secondary | ICD-10-CM

## 2022-09-13 DIAGNOSIS — N6320 Unspecified lump in the left breast, unspecified quadrant: Secondary | ICD-10-CM

## 2022-09-13 LAB — HEMOGLOBIN A1C
Est. average glucose Bld gHb Est-mCnc: 154 mg/dL
Hgb A1c MFr Bld: 7 % — ABNORMAL HIGH (ref 4.8–5.6)

## 2022-09-13 LAB — LIPID PANEL
Chol/HDL Ratio: 3.8 ratio (ref 0.0–4.4)
Cholesterol, Total: 188 mg/dL (ref 100–199)
HDL: 49 mg/dL (ref >39)
LDL Chol Calc (NIH): 113 mg/dL — ABNORMAL HIGH (ref 0–99)
Triglycerides: 146 mg/dL (ref 0–149)
VLDL Cholesterol Cal: 26 mg/dL (ref 5–40)

## 2022-09-13 LAB — GLUCOSE, RANDOM: Glucose: 127 mg/dL — ABNORMAL HIGH (ref 70–99)

## 2022-09-14 ENCOUNTER — Ambulatory Visit
Admission: RE | Admit: 2022-09-14 | Discharge: 2022-09-14 | Disposition: A | Discharge status: Home / Self Care | Payer: No Typology Code available for payment source | Source: Ambulatory Visit | Attending: Obstetrics and Gynecology | Admitting: Obstetrics and Gynecology

## 2022-09-14 DIAGNOSIS — N6320 Unspecified lump in the left breast, unspecified quadrant: Secondary | ICD-10-CM

## 2022-09-14 HISTORY — PX: BREAST BIOPSY: SHX20

## 2022-09-17 ENCOUNTER — Other Ambulatory Visit: Payer: Self-pay | Admitting: Obstetrics and Gynecology

## 2022-09-17 ENCOUNTER — Telehealth: Payer: Self-pay

## 2022-09-17 DIAGNOSIS — R921 Mammographic calcification found on diagnostic imaging of breast: Secondary | ICD-10-CM

## 2022-09-17 NOTE — Telephone Encounter (Signed)
Health coaching 2   interpreter- Natale Lay, [employer]   Labs-188 cholesterol, 113 LDL cholesterol, 146 triglycerides, 49 HDL cholesterol, 7.0 hemoglobin A1C , 127 mean plasma glucose. Patient understands and is aware of her lab results.   Goals-  1. Watch the amount of fried and fatty foods consumed. Try to grill, bake, broil or sautee foods in olive oil instead.  2. Reduce the amount of red meat consumed. Substitute for lean proteins like chicken, fish or [place]. 3. Reduce the amount of whole fat dairy products consumed. Substitute for low-fat or reduced-fat options instead. 4. Daily exercise for 20-30 minutes. 5. Reduce the amount of sweets and sugars consumed in both food and drinks. 6. Reduce the amount of carbs consumed. Spoke in detail with patient about daily recommendations. Will mail patient [employer] brochure to help with diet.   Navigation:  Patient is aware of 1 more health coaching sessions and a follow up. Referred patient to Internal Medicine for FU for elevated labs.   Time- 15 minutes

## 2022-09-20 ENCOUNTER — Ambulatory Visit
Admission: RE | Admit: 2022-09-20 | Discharge: 2022-09-20 | Disposition: A | Discharge status: Home / Self Care | Payer: No Typology Code available for payment source | Source: Ambulatory Visit | Attending: Obstetrics and Gynecology | Admitting: Obstetrics and Gynecology

## 2022-09-20 DIAGNOSIS — R921 Mammographic calcification found on diagnostic imaging of breast: Secondary | ICD-10-CM

## 2022-09-20 HISTORY — PX: BREAST BIOPSY: SHX20

## 2022-10-01 ENCOUNTER — Encounter: Payer: Self-pay | Admitting: Internal Medicine

## 2022-10-01 ENCOUNTER — Other Ambulatory Visit (HOSPITAL_COMMUNITY): Payer: Self-pay

## 2022-10-01 ENCOUNTER — Ambulatory Visit (INDEPENDENT_AMBULATORY_CARE_PROVIDER_SITE_OTHER): Payer: No Typology Code available for payment source | Admitting: Internal Medicine

## 2022-10-01 VITALS — BP 120/78 | HR 88 | Temp 98.9°F | Ht 61.5 in | Wt 207.9 lb

## 2022-10-01 DIAGNOSIS — N871 Moderate cervical dysplasia: Secondary | ICD-10-CM

## 2022-10-01 DIAGNOSIS — Z3009 Encounter for other general counseling and advice on contraception: Secondary | ICD-10-CM

## 2022-10-01 DIAGNOSIS — E1165 Type 2 diabetes mellitus with hyperglycemia: Secondary | ICD-10-CM

## 2022-10-01 MED ORDER — ATORVASTATIN CALCIUM 20 MG PO TABS
20.0000 mg | ORAL_TABLET | Freq: Every day | ORAL | 3 refills | Status: DC
Start: 2022-10-01 — End: 2023-01-30
  Filled 2022-10-01: qty 30, 30d supply, fill #0
  Filled 2022-11-12: qty 30, 30d supply, fill #1
  Filled 2022-12-18: qty 30, 30d supply, fill #2
  Filled 2023-01-28: qty 30, 30d supply, fill #3

## 2022-10-01 NOTE — Patient Instructions (Addendum)
Debbie Strickland por permitirnos brindarle su atencin hoy.   Diabetes Su A1c era 7 hace unas semanas. Esto confirma el diagnstico de diabetes. Contine con los cambios en la dieta y el ejercicio. Planearemos repetir A1c en septiembre. Si la A1c es> 7, entonces sera necesario comenzar a tomar una pastilla para ayudar con el nivel de azcar en la sangre.  Debbie Strickland comenzar a Set designer para ayudar a prevenir accidentes cerebrovasculares y ataques cardacos, ya que la diabetes aumenta el riesgo de sufrirlos. La atorvastatina reduce el colesterol, lo que reduce el riesgo de ataque cardaco.  Inclu documentacin para solicitar la tarjeta naranja.  Me comunicar con el centro de mujeres para aclarar si cubrirn el costo de la inyeccin de Depo.  He ordenado/cambiado los siguientes medicamentos:   Suspenda los siguientes medicamentos: No hay medicamentos discontinuados.   Inicie los siguientes medicamentos: Los mdicos ordenaron este encuentro Medicamentos  tableta de atorvastatina (LIPITOR) de 20 mg   Sig: Tome 1 tableta (20 mg en total) por va oral al da.   Dispensacin: 90 comprimidos   Recarga: 3   programa de mensajera instantnea    Seguimiento: 3 meses  Esperamos verte la prxima vez. Llame a nuestra clnica al [phone] si tiene alguna pregunta o inquietud. El mejor momento para llamar es de lunes a viernes de 9 a. m. a 4 p. m., pero hay alguien disponible las 24 horas, los 7 [address]  [address] de la [place]. Si es fuera del horario de atencin o durante el fin de [place], llame al nmero principal del hospital y pregunte por el residente de guardia de medicina interna. Si necesita reabastecimiento de medicamentos, notifique a su farmacia con una semana de anticipacin y ellos nos enviarn una solicitud.   Gracias por confiarme tu cuidado. Deseandote lo mejor!   Rudene Christians, DO [facility]  Thank you, [name] [name]  for allowing [place] to provide your care today.   Diabetes Your A1c was 7 a few weeks ago. This confirms diagnosis of diabetes. Please continue with dietary and exercise changes. We will plan to repeat A1c in September. If A1c is >7 then would need to start a pill medication to help with blood sugars.  I do want to start a medication to help prevent strokes and heart attacks as diabetes places you at higher risk for these. Atorvastatin decreases cholesterol which lowers heart attack risk.  I included paperwork to apply for the orange card.  I will reach out to [facility] center to clarify about if they will cover cost of Depo injection.  I have ordered the following medication/changed the following medications:   Stop the following medications: There are no discontinued medications.   Start the following medications: Meds ordered this encounter  Medications   atorvastatin (LIPITOR) 20 MG tablet    Sig: Take 1 tablet (20 mg total) by mouth daily.    Dispense:  90 tablet    Refill:  3    IM program     Follow up: 3 months  We look forward to seeing you next time. Please call our clinic at [phone] if you have any questions or concerns. The best time to call is Monday-Friday from 9am-4pm, but there is someone available 24/7. If after hours or the weekend, call the main hospital number and ask for the Internal Medicine Resident On-Call. If you need medication refills, please notify your pharmacy one week in advance and they will send [place] a  request.   Thank you for trusting me with your care. Wishing you the best!   Rudene Christians, DO [facility]

## 2022-10-01 NOTE — Progress Notes (Addendum)
Subjective:  CC: establish care  HPI:  [name] is a 42 y.o. female with a past medical history stated below and presents today to establish care after getting labs completed recently with the wise woman program. Please see problem based assessment and plan for additional details.  Interpretor used during encounter.  Past Medical History:  Diagnosis Date   Diabetes (HCC)    HSIL (high grade squamous intraepithelial lesion) on Pap smear of cervix     Current Outpatient Medications on File Prior to Visit  Medication Sig Dispense Refill   Cholecalciferol (VITAMIN D3) 50 MCG (2000 UT) capsule Take 1 capsule (2,000 Units total) by mouth daily. (Patient not taking: Reported on 08/30/2022) 30 capsule 5   Prenat-Fe Carbonyl-FA-Omega 3 (ONE-A-DAY WOMENS PRENATAL 1) 28-0.8-235 MG CAPS Take 1 capsule by mouth daily. (Patient not taking: Reported on 08/30/2022)     No current facility-administered medications on file prior to visit.    Family History  Problem Relation Age of Onset   Diabetes Mother    Hypertension Mother    Heart disease Mother    Lung cancer Father    Breast cancer Neg Hx     Social History   Socioeconomic History   Marital status: Married    Spouse name: Ezequiel   Number of children: 3   Years of education: Not on file   Highest education level: Not on file  Occupational History   Not on file  Tobacco Use   Smoking status: Never   Smokeless tobacco: Never  Vaping Use   Vaping Use: Never used  Substance and Sexual Activity   Alcohol use: Not Currently    Comment: rarely   Drug use: Never   Sexual activity: Yes    Birth control/protection: Pill  Other Topics Concern   Not on file  Social History Narrative   Not on file   Social Determinants of Health   Financial Resource Strain: Not on file  Food Insecurity: No Food Insecurity (08/30/2022)   Hunger Vital Sign    Worried About Running Out of Food in the Last Year: Never true     Ran Out of Food in the Last Year: Never true  Transportation Needs: No Transportation Needs (08/30/2022)   PRAPARE - Administrator, Civil Service (Medical): No    Lack of Transportation (Non-Medical): No  Physical Activity: Not on file  Stress: Not on file  Social Connections: Not on file  Intimate Partner Violence: Not on file    Review of Systems: ROS negative except for what is noted on the assessment and plan.  Objective:   Vitals:   10/01/22 1359 10/01/22 1412  BP: (!) 143/83 120/78  Pulse: 86 88  Temp: 98.9 F (37.2 C)   TempSrc: Oral   SpO2: 99%   Weight: 207 lb 14.4 oz (94.3 kg)   Height: 5' 1.5" (1.562 m)     Physical Exam: Constitutional: well-appearing, acanthosis nigrans Cardiovascular: regular rate and rhythm, no m/r/g Pulmonary/Chest: normal work of breathing on room air, lungs clear to auscultation bilaterally Abdominal: soft, non-tender, non-distended MSK: normal bulk and tone Skin: warm and dry   Assessment & Plan:  Diabetes [facility]) Lab Results  Component Value Date   HGBA1C 7.0 (H) 09/11/2022    History of gestational diabetes. Recent A1c at 7 consistent with type 2 diabetes. She was not aware of diagnosis, this was reviewed as well as risk of complications and goals for A1c to help  decrease risks. At wise woman program she received education on recommended dietary changes. She has started implementing those changes as well as increasing exercise daily. P: -Repeat A1c in 3 months, if greater than 7 would start metformin -Moderate intensity statin with atorvastatin 20 mg started. Goal is for primary prevention with LDL <100. 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 Since then she has completed 2 pap smears with normal cytology and negative HPV most recently 5/24. P: No exact guidelines on follow-up for her case. Would recommend closer  repeat of pap, possiblity in 2-3 years. She can receive free screening through breast cancer and cervical cancer program.  Birth control counseling Her preferred birth control would be Depo provera injections every 3 months. I will check on price of this prior to ordering. Injections would also have to be timed for week during period. -checking with office staff about price of Depo injection  Addendum: Total cost $50 per injection. I called and talked with her about this. She would like to discuss with husband and will let clinic know if she wants injection.   Patient discussed with Dr. Precious Bard Aarya Quebedeaux, D.O. [facility]  PGY-2 Pager: [phone]  Phone: [phone] Date 10/02/2022  Time 2:09 PM

## 2022-10-02 ENCOUNTER — Encounter: Payer: Self-pay | Admitting: Internal Medicine

## 2022-10-02 DIAGNOSIS — E119 Type 2 diabetes mellitus without complications: Secondary | ICD-10-CM | POA: Insufficient documentation

## 2022-10-02 DIAGNOSIS — Z3009 Encounter for other general counseling and advice on contraception: Secondary | ICD-10-CM | POA: Insufficient documentation

## 2022-10-02 NOTE — Assessment & Plan Note (Addendum)
Her preferred birth control would be Depo provera injections every 3 months. I will check on price of this prior to ordering. Injections would also have to be timed for week during period. -checking with office staff about price of Depo injection  Addendum: Total cost $50 per injection. I called and talked with her about this. She would like to discuss with husband and will let clinic know if she wants injection.

## 2022-10-02 NOTE — Assessment & Plan Note (Signed)
HSIL/ HPV- 04/21 with colposcopy HSIL then Colposcopy with LGSIL 10/21. Since then she has completed 2 pap smears with normal cytology and negative HPV most recently 5/24. P: No exact guidelines on follow-up for her case. Would recommend closer repeat of pap, possiblity in 2-3 years. She can receive free screening through breast cancer and cervical cancer program.

## 2022-10-02 NOTE — Assessment & Plan Note (Signed)
Lab Results  Component Value Date   HGBA1C 7.0 (H) 09/11/2022    History of gestational diabetes. Recent A1c at 7 consistent with type 2 diabetes. She was not aware of diagnosis, this was reviewed as well as risk of complications and goals for A1c to help decrease risks. At wise woman program she received education on recommended dietary changes. She has started implementing those changes as well as increasing exercise daily. P: -Repeat A1c in 3 months, if greater than 7 would start metformin -Moderate intensity statin with atorvastatin 20 mg started. Goal is for primary prevention with LDL <100. -repeat lipid panel in 3 months

## 2022-10-04 NOTE — Progress Notes (Signed)
Internal Medicine Clinic Attending  Case discussed with Dr. Masters  At the time of the visit.  We reviewed the resident's history and exam and pertinent patient test results.  I agree with the assessment, diagnosis, and plan of care documented in the resident's note.  

## 2022-10-04 NOTE — Addendum Note (Signed)
Addended by: Dickie La on: 10/04/2022 04:45 PM   Modules accepted: Level of Service

## 2022-11-12 ENCOUNTER — Other Ambulatory Visit (HOSPITAL_COMMUNITY): Payer: Self-pay

## 2022-12-18 ENCOUNTER — Other Ambulatory Visit (HOSPITAL_COMMUNITY): Payer: Self-pay

## 2023-01-28 ENCOUNTER — Other Ambulatory Visit: Payer: Self-pay

## 2023-01-30 ENCOUNTER — Ambulatory Visit (INDEPENDENT_AMBULATORY_CARE_PROVIDER_SITE_OTHER): Payer: Self-pay | Admitting: Internal Medicine

## 2023-01-30 ENCOUNTER — Other Ambulatory Visit (HOSPITAL_COMMUNITY): Payer: Self-pay

## 2023-01-30 VITALS — BP 129/80 | HR 93 | Temp 98.7°F | Wt 191.9 lb

## 2023-01-30 DIAGNOSIS — N921 Excessive and frequent menstruation with irregular cycle: Secondary | ICD-10-CM

## 2023-01-30 DIAGNOSIS — E1165 Type 2 diabetes mellitus with hyperglycemia: Secondary | ICD-10-CM

## 2023-01-30 DIAGNOSIS — E119 Type 2 diabetes mellitus without complications: Secondary | ICD-10-CM

## 2023-01-30 DIAGNOSIS — E785 Hyperlipidemia, unspecified: Secondary | ICD-10-CM

## 2023-01-30 DIAGNOSIS — Z3202 Encounter for pregnancy test, result negative: Secondary | ICD-10-CM

## 2023-01-30 DIAGNOSIS — N915 Oligomenorrhea, unspecified: Secondary | ICD-10-CM

## 2023-01-30 LAB — POCT GLYCOSYLATED HEMOGLOBIN (HGB A1C): Hemoglobin A1C: 6.1 % — AB (ref 4.0–5.6)

## 2023-01-30 LAB — GLUCOSE, CAPILLARY: Glucose-Capillary: 107 mg/dL — ABNORMAL HIGH (ref 70–99)

## 2023-01-30 LAB — POCT URINE PREGNANCY: Preg Test, Ur: NEGATIVE

## 2023-01-30 MED ORDER — ATORVASTATIN CALCIUM 20 MG PO TABS
20.0000 mg | ORAL_TABLET | Freq: Every day | ORAL | 3 refills | Status: DC
  Filled 2023-01-30: qty 90, 90d supply, fill #0
  Filled 2023-01-30: qty 30, 30d supply, fill #0
  Filled 2023-03-20: qty 30, 30d supply, fill #1

## 2023-01-30 MED ORDER — NORETHIN ACE-ETH ESTRAD-FE 1-20 MG-MCG PO TABS
1.0000 | ORAL_TABLET | Freq: Every day | ORAL | 11 refills | Status: AC
Start: 2023-01-30 — End: ?
  Filled 2023-01-30: qty 28, 28d supply, fill #0

## 2023-01-30 NOTE — Progress Notes (Unsigned)
Subjective:  CC: diabetes  HPI:  [name] is a 42 y.o. female with a past medical history stated below and presents today for diabetes and birth control. She also has acute concern with abnormal periods. Please see problem based assessment and plan for additional details.  Past Medical History:  Diagnosis Date   BMI 40.0-44.9, adult (HCC) 08/12/2019   Diabetes (HCC)    HSIL (high grade squamous intraepithelial lesion) on Pap smear of cervix     Current Outpatient Medications on File Prior to Visit  Medication Sig Dispense Refill   Cholecalciferol (VITAMIN D3) 50 MCG (2000 UT) capsule Take 1 capsule (2,000 Units total) by mouth daily. (Patient not taking: Reported on 08/30/2022) 30 capsule 5   No current facility-administered medications on file prior to visit.    Family History  Problem Relation Age of Onset   Diabetes Mother    Hypertension Mother    Heart disease Mother    Lung cancer Father    Breast cancer Neg Hx     Social History   Socioeconomic History   Marital status: Married    Spouse name: Ezequiel   Number of children: 3   Years of education: Not on file   Highest education level: Not on file  Occupational History   Not on file  Tobacco Use   Smoking status: Never   Smokeless tobacco: Never  Vaping Use   Vaping status: Never Used  Substance and Sexual Activity   Alcohol use: Not Currently    Comment: rarely   Drug use: Never   Sexual activity: Yes    Birth control/protection: Pill  Other Topics Concern   Not on file  Social History Narrative   Not on file   Social Determinants of Health   Financial Resource Strain: Not on file  Food Insecurity: No Food Insecurity (08/30/2022)   Hunger Vital Sign    Worried About Running Out of Food in the Last Year: Never true    Ran Out of Food in the Last Year: Never true  Transportation Needs: No Transportation Needs (08/30/2022)   PRAPARE - Administrator, Civil Service  (Medical): No    Lack of Transportation (Non-Medical): No  Physical Activity: Not on file  Stress: Not on file  Social Connections: Not on file  Intimate Partner Violence: Not on file    Review of Systems: ROS negative except for what is noted on the assessment and plan.  Objective:   Vitals:   01/30/23 1401  BP: 129/80  Pulse: 93  Temp: 98.7 F (37.1 C)  TempSrc: Oral  SpO2: 100%  Weight: 191 lb 14.4 oz (87 kg)    Physical Exam: Constitutional: well-appearing Cardiovascular: regular rate and rhythm, no m/r/g Pulmonary/Chest: normal work of breathing on room air, lungs clear to auscultation bilaterally Abdominal: soft, non-tender, non-distended MSK: normal bulk and tone, 2+ DP pulses bilaterally, vibratory sensation intact to L4, L5, and S1 dermatomes bilaterally, monofilment testing with good sensation to 10 points bilaterally, no wounds or calluses noted Skin: warm and dry   Assessment & Plan:  Diabetes [facility]) Patient was diagnosed with diabetes in June. A1c was 7 at that time. She has lost about 15 lbs in last 4 months with diet and increased walking. Repeat A1c at 6.1 today. P: A1c improved today, diabetic foot exam completed I talked to patient's and her daughter about completing workup. Will plan to complete BMP and urine microalbumin Plan to refer to ophthalmology  once orange card paperwork has been processed  Oligomenorrhea Since the beginning of October, she has had a period that seems more like spotting then her usual period. Prior to this she had regular periods that were monthly. She had been getting Depo shots every 3 months but is missed shot that was due in September.  A: I think irregular period could be from discontinuation of Depo shot versus peri-menopause. P: -Urine pregnancy negative -start loestrin -f/u in 4 weeks   Patient discussed with Dr. Precious Bard Debbie Strickland, D.O. [facility]  PGY-3 Pager: [phone]  Phone:  [phone] Date 01/31/2023  Time 4:13 PM

## 2023-01-30 NOTE — Patient Instructions (Addendum)
Thank you, Debbie Strickland for allowing [place] to provide your care today.   Diabetes: your A1c was at 6.1 which is great. Please complete orange care paperwork and we will plan to complete blood work sometime in next few months.  Bleeding This could be from your body adjusting off of Depo. I am checking a urine pregnancy test today and if negative you can start birth control.   I have ordered the following labs for you:  Lab Orders         Glucose, capillary         POC Hbg A1C         POCT Urine Pregnancy       I have ordered the following medication/changed the following medications:   Stop the following medications: Medications Discontinued During This Encounter  Medication Reason   atorvastatin (LIPITOR) 20 MG tablet Reorder   Prenat-Fe Carbonyl-FA-Omega 3 (ONE-A-DAY WOMENS PRENATAL 1) 28-0.8-235 MG CAPS      Start the following medications: Meds ordered this encounter  Medications   atorvastatin (LIPITOR) 20 MG tablet    Sig: Take 1 tablet (20 mg total) by mouth daily.    Dispense:  90 tablet    Refill:  3    IM program   norethindrone-ethinyl estradiol-FE (LOESTRIN FE) 1-20 MG-MCG tablet    Sig: Take 1 tablet by mouth daily.    Dispense:  28 tablet    Refill:  11     Follow up:  1 month for vaginal bleeding  We look forward to seeing you next time. Please call our clinic at [phone] if you have any questions or concerns. The best time to call is Monday-Friday from 9am-4pm, but there is someone available 24/7. If after hours or the weekend, call the main hospital number and ask for the Internal Medicine Resident On-Call. If you need medication refills, please notify your pharmacy one week in advance and they will send [place] a request.   Thank you for trusting me with your care. Wishing you the best!   Rudene Christians, DO [facility]

## 2023-01-31 ENCOUNTER — Encounter: Payer: Self-pay | Admitting: Internal Medicine

## 2023-01-31 DIAGNOSIS — N915 Oligomenorrhea, unspecified: Secondary | ICD-10-CM | POA: Insufficient documentation

## 2023-01-31 NOTE — Assessment & Plan Note (Addendum)
Patient was diagnosed with diabetes in June. A1c was 7 at that time. She has lost about 15 lbs in last 4 months with diet and increased walking. Repeat A1c at 6.1 today. P: A1c improved today, diabetic foot exam completed I talked to patient's and her daughter about completing workup. Will plan to complete BMP and urine microalbumin Plan to refer to ophthalmology once orange card paperwork has been processed

## 2023-01-31 NOTE — Assessment & Plan Note (Signed)
Since the beginning of October, she has had a period that seems more like spotting then her usual period. Prior to this she had regular periods that were monthly. She had been getting Depo shots every 3 months but is missed shot that was due in September.  A: I think irregular period could be from discontinuation of Depo shot versus peri-menopause. P: -Urine pregnancy negative -start loestrin -f/u in 4 weeks

## 2023-02-01 NOTE — Addendum Note (Signed)
Addended by: Dickie La on: 02/01/2023 09:01 AM   Modules accepted: Level of Service

## 2023-02-01 NOTE — Progress Notes (Signed)
Internal Medicine Clinic Attending  Case discussed with the resident at the time of the visit.  We reviewed the resident's history and exam and pertinent patient test results.  I agree with the assessment, diagnosis, and plan of care documented in the resident's note.  

## 2023-03-20 ENCOUNTER — Other Ambulatory Visit: Payer: Self-pay

## 2023-03-20 ENCOUNTER — Other Ambulatory Visit (HOSPITAL_COMMUNITY): Payer: Self-pay

## 2023-07-05 ENCOUNTER — Telehealth: Payer: Self-pay

## 2023-07-05 NOTE — Telephone Encounter (Signed)
 Health Coaching 3  interpreter-Erika [place], [place]  Patient has been working on trying to get her blood sugars under better control. Patient was diagnosed with T2DM at FU visit with PCP. Patient was given the chance to make modifications with her diet and exercise before starting any type of medications. Patient has been working on making changes with her diet since her initial screening. Patient has also been walking often for exercise. Since her initial screening patient has lost 15 pounds. Patient had recheck of her A1C in October and did see improvement with a reading of 6.1 vs 7.0. Patient also started a statin to help improve her lipids. Patient has been doing a great job of making lifestyle changes and is hopeful that she will not have to start any type of medication for her diabetes. Encouraged patient to keep up the great work!   Navigation:  Patient is aware of  a follow up session. Patient is scheduled for FU on 08/09/23 @ 10:00 am   Time- 15 minutes

## 2023-08-02 ENCOUNTER — Other Ambulatory Visit: Payer: Self-pay | Admitting: Obstetrics and Gynecology

## 2023-08-02 DIAGNOSIS — Z1231 Encounter for screening mammogram for malignant neoplasm of breast: Secondary | ICD-10-CM

## 2023-08-06 NOTE — Progress Notes (Signed)
 [facility]woman follow up   Interpreter: Herma Longest, Lenis Quin  Clinical Measurement:   Vitals:   08/09/23 0957 08/09/23 1013  BP: (!) 141/87 (!) 140/87      Medical History: Patient states that she  does not know if she has  high cholesterol, has high blood pressure and she has diabetes. Patient states that she has history of gestational hypertension, does not have history of pre-eclampsia/eclampsia and she does not have history of gestational diabetes.     Medications: Patient states that she does not take medication to lower cholesterol, blood pressure and blood sugar.  Patient does not take an aspirin a day to help prevent a heart attack or stroke.    Blood pressure, self measurement: Patient states that she does measure blood pressure from home. She checks her blood pressure weekly. She shares her readings with a health care provider: yes.   Nutrition: Patient states that on average she eats 1 cups of fruit and 1 cups of vegetables per day. Patient states that she does not eat fish at least 2 times per week. Patient eats less than half servings of whole grains. Patient drinks less than 36 ounces of beverages with added sugar weekly: yes. Patient is currently watching sodium or salt intake: yes. In the past 7 days patient has had 0 drinks containing alcohol. On average patient drinks 0 drinks containing alcohol per day.      Physical activity: Patient states that she gets 0 minutes of moderate and 0 minutes of vigorous physical activity each week.  Smoking status: Patient states that she has has never smoked .   Quality of life: Over the past 2 weeks patient states that she had little interest or pleasure in doing things: not at all. She has been feeling down, depressed or hopeless:not at all.   Social Determinants of Health Assessment:   Computer Use: During the last 12 months patient states that she has used any of the following: desktop/laptop, smart phone or tablet/other portable  wireless computer: yes.   Internet Use: During the last 12 months, did you or any member of your household have access to the internet: Yes, by paying a cell phone company or internet service provider.   Food Insecurities: During the last 12 months, where there any times when you were worried that you would run out of food because of a lack of money or other resources: No.   Transportation Barriers: During the last 12 months, have you missed a doctor's appointment because of transportation problems: No.   Childcare Barriers: If you are currently using childcare services, please identify  the type of services you use. (If not using childcare services, please select "Not applicable"): not applicable. During the last 12 months, have you had any barriers to childcare services such as: not applicable.   Housing: What is your housing situation today: I have housing.   Intimate Partner Violence: During the last 12 months, how often did your partner physically hurt you: never. During the last 12 months, how often did your partner insult you or talk down to you: never.  Medication Adherence: During the last 12 months, did you ever forget to take your medicine: No. During the last 12 months, were you careless ar times about taking your medicine: No. During the last 12 months, when you felt better did you sometimes stop taking your medication: No. During the last 12 months, sometimes if you felt worse when you took your medicine did you stop taking it:  No.    Risk reduction and counseling:  Spoke with patient about adding more whole grains into diet. Gave suggestions for whole wheat bread, oatmeal, whole grain cereals, brown rice or whole wheat pasta. Encouraged patient to continue watching the amount of sodium that she consumes due to BP being elevated in the past. Patient has not been exercising. Encouraged her to try and start walking for 15 minutes daily with a goal of 20-30 minutes daily. Patient has lost  14 pounds since her initial screening. Patient has been working on making dietary changes to lower her glucose. Patient wants to avoid having to take medications. Encouraged patient to continue with these lifestyle changes.    Navigation: This was the  follow up session for this patient, I will check up on her progress in the coming months.

## 2023-08-09 ENCOUNTER — Inpatient Hospital Stay: Attending: Obstetrics and Gynecology | Admitting: *Deleted

## 2023-08-09 VITALS — BP 140/87 | Ht 61.5 in | Wt 199.2 lb

## 2023-08-09 DIAGNOSIS — Z0000 Encounter for general adult medical examination without abnormal findings: Secondary | ICD-10-CM

## 2023-09-13 ENCOUNTER — Other Ambulatory Visit: Payer: Self-pay

## 2023-09-13 ENCOUNTER — Inpatient Hospital Stay: Payer: Self-pay | Attending: Obstetrics and Gynecology | Admitting: *Deleted

## 2023-09-13 VITALS — BP 121/78 | Ht 61.5 in | Wt 201.3 lb

## 2023-09-13 DIAGNOSIS — Z0000 Encounter for general adult medical examination without abnormal findings: Secondary | ICD-10-CM

## 2023-09-13 NOTE — Progress Notes (Signed)
 Wisewoman Re-Screening   Interpreter- Heidi Llamas   Clinical Measurement:  Vitals:   09/13/23 0927 09/13/23 0941  BP: 126/77 121/78   Fasting Labs Drawn Today, will review with patient when they result.   Medical History: Patient states that she does not know if she has high cholesterol, does not have high blood pressure and she has diabetes. Patient states that she has history of gestational hypertension, does not have history of pre-eclampsia/eclampsia and she does not have history of gestational diabetes.    Medications: Patient states that she does not take medication to lower cholesterol, blood pressure or blood sugar.  Patient does not take an aspirin a day to help prevent a heart attack or stroke.   Blood pressure, self measurement: Patient states that she does measure blood pressure from home. She checks her blood pressure weekly. She shares her readings with a health care provider: no.   Nutrition: Patient states that on average she eats 1 cups of fruit and 1 cups of vegetables per day. Patient states that she does not eat fish at least 2 times per week. Patient eats less than half servings of whole grains. Patient drinks less than 36 ounces of beverages with added sugar weekly: yes. Patient is currently watching sodium or salt intake: yes. In the past 7 days patient has consumed drinks containing alcohol on 0 days. On a day that patient consumes drinks containing alcohol on average 0 drinks are consumed.      Physical activity: Patient states that she gets 120 minutes of moderate and 0 minutes of vigorous physical activity each week.  Smoking status: Patient states that she has has never smoked .   Quality of life: Over the past 2 weeks patient states that she had little interest or pleasure in doing things: not at all. She has been feeling down, depressed or hopeless:not at all.   Social Determinants of Health Assessment:   Computer Use: During the last 12 months  patient states that she has used any of the following: desktop/laptop, smart phone or tablet/other portable wireless computer: yes.   Internet Use: During the last 12 months, did you or any member of your household have access to the internet: Yes, by paying a cell phone company or internet service provider.   Food Insecurities: During the last 12 months, where there any times when you were worried that you would run out of food because of a lack of money or other resources: No.   Transportation Barriers: During the last 12 months, have you missed a doctor's appointment because of transportation problems: No.   Childcare Barriers: If you are currently using childcare services, please identify  the type of services you use. (If not using childcare services, please select "Not applicable"): not applicable. During the last 12 months, have you had any barriers to childcare services such as: not applicable.   Housing: What is your housing situation today: I have housing.   Intimate Partner Violence: During the last 12 months, how often did your partner physically hurt you: never. During the last 12 months, how often did your partner insult you or talk down to you: never.  Medication Adherence: During the last 12 months, did you ever forget to take your medicine: not applicable. During the last 12 months, were you careless ar times about taking your medicine: not applicable. During the last 12 months, when you felt better did you sometimes stop taking your medication: not applicable. During the last 12 months,  sometimes if you felt worse when you took your medicine did you stop taking it: not applicable.   Risk reduction and counseling:   Health Coaching: Spoke with patient about the daily recommendations for fruits and vegetables. Showed patient what a serving size would look like. Patient currently consumes fish twice a month. Spoke with patient about how fish is a low fat protein and can be good for  heart health. Patient consumes whole wheat bread and whole grain cereals. Patient does not consume beverages with added sugars often. Patient does try to watch her salt intake. Patient has been walking 3 days per week for 40-45 minutes. Explained to patient that the recommendation is for 150 minutes weekly.   Goal: Patient will increase her fruit and vegetable intake to 2 servings of each daily. Patient will work on reaching this goal over the next month.   Navigation:  I will notify patient of lab results.  Patient is aware of 2 more health coaching sessions and a follow up.

## 2023-09-14 LAB — LIPID PANEL
Chol/HDL Ratio: 3.6 ratio (ref 0.0–4.4)
Cholesterol, Total: 170 mg/dL (ref 100–199)
HDL: 47 mg/dL (ref >39)
LDL Chol Calc (NIH): 99 mg/dL (ref 0–99)
Triglycerides: 136 mg/dL (ref 0–149)
VLDL Cholesterol Cal: 24 mg/dL (ref 5–40)

## 2023-09-14 LAB — GLUCOSE, RANDOM: Glucose: 109 mg/dL — ABNORMAL HIGH (ref 70–99)

## 2023-09-14 LAB — HEMOGLOBIN A1C
Est. average glucose Bld gHb Est-mCnc: 131 mg/dL
Hgb A1c MFr Bld: 6.2 % — ABNORMAL HIGH (ref 4.8–5.6)

## 2023-09-23 ENCOUNTER — Telehealth: Payer: Self-pay

## 2023-09-23 NOTE — Telephone Encounter (Signed)
 Health coaching 2   interpreter- Herma Longest, [employer]   Labs- 170 cholesterol, 99 LDL cholesterol, 136 triglycerides, 47 HDL cholesterol, 6.2 hemoglobin A1C, 109 mean plasma glucose. Patient understands and is aware of her lab results.   Goals-  1. Continue watching the amount of fried and fatty foods consumed. 2. Continue watching the amount of sweet and sugary foods and drinks consumed. 3. Continue watching the amount of carbs consumed. 4. Daily exercise for 20-30 minutes.   Navigation:  Patient is aware of 1 more health coaching sessions and a follow up. Referred patient to Internal Medicine for FU for elevated labs. Will call patient back with appointment information once scheduled.

## 2023-09-25 ENCOUNTER — Encounter: Payer: Self-pay | Admitting: *Deleted

## 2023-10-13 DIAGNOSIS — E785 Hyperlipidemia, unspecified: Secondary | ICD-10-CM | POA: Insufficient documentation

## 2023-10-13 NOTE — Assessment & Plan Note (Addendum)
-  Patient was diagnosed with diabetes in June 2024. -Diabetes well-controlled on lifestyle modifications; most recent A1c stable at 6.2 -Plan:    -Diabetic Foot Exam due October 2025    -Needs ophthalmologic evaluation; will place referral    -Ordered RFP + urine microalbumin with eGFR for kidney evaluation; will consider starting ACE inhibitor for renal protection based on results

## 2023-10-13 NOTE — Assessment & Plan Note (Signed)
-  LDL still not at goal of < 70 for primary prevention -currently taking atorvastatin  20mg ; will increase to 40mg  to further decrease LDL as patient already implementing lifestyle modifications

## 2023-10-13 NOTE — Progress Notes (Unsigned)
 Patient name: Debbie Strickland Date of birth: [birth year] Date of visit: 10/14/23  Type of visit: Established Patient Office Visit   Subjective   Chief concern:  Chief Complaint  Patient presents with   Wise Women Follow up    Debbie Strickland is a 43 y.o. female with a PMHx of Diabetes who presents to [facility] clinic for wise woman exam and follow-up on her diabetes. Her home blood pressure usually runs around 135/80 at home.  Notes an episode of DOE this past Sunday while hiking during hot weather. Has not had symptoms since then. States she had a previous history of asthma as a child but has never used an inhaler. Also reports associated dizziness.  Today in the office she is completely asymptomatic.  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     Past Surgical History:  Procedure Laterality Date   BREAST BIOPSY Left 09/14/2022   US  LT BREAST BX W LOC DEV 1ST LESION IMG BX SPEC US  GUIDE 09/14/2022 [facility]   BREAST BIOPSY Left 09/14/2022   US  LT BREAST BX W LOC DEV EA ADD LESION IMG BX SPEC US  GUIDE 09/14/2022 [facility]   BREAST BIOPSY Left 09/20/2022   MM LT BREAST BX W LOC DEV 1ST LESION IMAGE BX SPEC STEREO GUIDE 09/20/2022 [facility]   BREAST BIOPSY Left 09/20/2022   MM LT BREAST BX W LOC DEV EA AD LESION IMG BX SPEC STEREO GUIDE 09/20/2022 [facility]   NO PAST SURGERIES      Review of Systems  Constitutional:  Negative for fever and weight loss.  Eyes:  Negative for blurred vision.  Respiratory:   Negative for cough, shortness of breath and wheezing.   Cardiovascular:  Negative for chest pain, palpitations and leg swelling.  Gastrointestinal:  Negative for abdominal pain, nausea and vomiting.  Neurological:  Negative for dizziness and headaches.    Current Outpatient Medications  Medication Instructions   atorvastatin  (LIPITOR) 20 mg, Oral, Daily   ibuprofen  (ADVIL ) 200 mg, Every 6 hours PRN   Multiple Vitamin (MULTIVITAMIN) tablet 1 tablet, Daily   norethindrone -ethinyl estradiol -FE (LOESTRIN  FE) 1-20 MG-MCG tablet 1 tablet, Oral, Daily   Vitamin D3 2,000 Units, Oral, Daily    Social History   Tobacco Use   Smoking status: Never   Smokeless tobacco: Never  Vaping Use   Vaping status: Never Used  Substance Use Topics   Alcohol use: Not Currently    Comment: rarely   Drug use: Never      Objective  Today's Vitals   10/14/23 1108  BP: 134/85  Pulse: 73  SpO2: 98%  Weight: 203 lb 6.4 oz (92.3 kg)  Height: 5' 1.5 (1.562 m)  Body mass index is 37.81 kg/m.   Physical Exam Constitutional:      General: She is not in acute distress.    Appearance: Normal appearance. She is obese. She is not ill-appearing or diaphoretic.  HENT:     Head: Normocephalic and atraumatic.     Mouth/Throat:     Mouth: Mucous membranes are moist.  Pharynx: Oropharynx is clear.  Eyes:     General: No scleral icterus.    Extraocular Movements: Extraocular movements intact.     Conjunctiva/sclera: Conjunctivae normal.     Pupils: Pupils are equal, round, and reactive to light.  Cardiovascular:     Rate and Rhythm: Normal rate and regular rhythm.     Heart sounds: Normal heart sounds. No murmur heard. Pulmonary:     Effort: Pulmonary effort is normal. No respiratory distress.     Breath sounds: Normal breath sounds. No stridor. No wheezing, rhonchi or rales.  Chest:     Chest wall: No tenderness.  Abdominal:     General: Abdomen is flat. Bowel sounds are normal. There is no  distension.     Palpations: Abdomen is soft. There is no mass.     Tenderness: There is no abdominal tenderness. There is no guarding.  Musculoskeletal:     Cervical back: Normal range of motion.     Right lower leg: No edema.     Left lower leg: No edema.  Skin:    Coloration: Skin is not jaundiced or pale.     Findings: No bruising, erythema or rash.  Neurological:     Mental Status: She is alert and oriented to person, place, and time. Mental status is at baseline.     Gait: Gait normal.  Psychiatric:        Mood and Affect: Mood normal.        Behavior: Behavior normal.     Last CBC Lab Results  Component Value Date   WBC 16.1 (H) 12/23/2019   HGB 12.0 12/23/2019   HCT 38.4 12/23/2019   MCV 82.8 12/23/2019   MCH 25.9 (L) 12/23/2019   RDW 15.3 12/23/2019   PLT 232 12/23/2019   Last metabolic panel Lab Results  Component Value Date   GLUCOSE 109 (H) 09/13/2023   NA 136 12/21/2019   K 4.0 12/21/2019   CL 108 12/21/2019   CO2 20 (L) 12/21/2019   BUN 7 12/21/2019   CREATININE 0.61 12/21/2019   GFRNONAA >60 12/21/2019   CALCIUM  8.3 (L) 12/21/2019   PROT 5.9 (L) 12/21/2019   ALBUMIN 2.5 (L) 12/21/2019   LABGLOB 2.6 06/25/2019   AGRATIO 1.7 06/25/2019   BILITOT 0.2 (L) 12/21/2019   ALKPHOS 96 12/21/2019   AST 19 12/21/2019   ALT 11 12/21/2019   ANIONGAP 8 12/21/2019   Last lipids Lab Results  Component Value Date   CHOL 170 09/13/2023   HDL 47 09/13/2023   LDLCALC 99 09/13/2023   TRIG 136 09/13/2023   CHOLHDL 3.6 09/13/2023   Last hemoglobin A1c Lab Results  Component Value Date   HGBA1C 6.2 (H) 09/13/2023        Assessment & Plan  Problem List Items Addressed This Visit       Cardiovascular and Mediastinum   Hypertension associated with diabetes (HCC)   -BP is around 135/80, goal is <130 -Discussed with pt about further lifestyle modifications vs. Starting low dose BP medication -Plan: Pt prefers to just use exercise to manage her BP. Will  reassess in 3 months.      Relevant Medications   atorvastatin  (LIPITOR) 20 MG tablet     Endocrine   Diabetes (HCC) - Primary   -Patient was diagnosed with diabetes in June 2024. -Diabetes well-controlled on lifestyle modifications; most recent A1c stable at 6.2 -Plan:    -Diabetic Foot Exam due October 2025    -Needs ophthalmologic evaluation;  will hold off on placing referral until patient receives financial assistance    -Will order RFP + urine microalbumin with eGFR for kidney evaluation at next visit      Relevant Medications   atorvastatin  (LIPITOR) 20 MG tablet     Other   Hyperlipidemia   -LDL at goal < 100 for primary prevention -continue taking atorvastatin  20mg  and lifestyle changes      Relevant Medications   atorvastatin  (LIPITOR) 20 MG tablet   Dyspnea on exertion   Episode of DOE while hiking in hot setting. Patient notes a history of asthma as a child, but never used an inhaler. Completely asymptomatic at this visit, no SOB, further episodes of DOE, CP, fever, cough. Lung exam CTAB, no need for further workup. Will reassess patient at next visit. If she continues having these episodes will consider PFTs/Albuterol inhaler.      Encounter for screening involving social determinants of health [facility])   -Discussed meeting with Eric for application for financial assistance.       Return in about 3 months (around 01/14/2024).   Patient discussed with Dr. Lovie, who also saw and evaluated the patient.  Kymberly Blomberg, MD [facility]  PGY-1 10/14/2023, 1:01 PM

## 2023-10-14 ENCOUNTER — Other Ambulatory Visit (HOSPITAL_COMMUNITY): Payer: Self-pay

## 2023-10-14 ENCOUNTER — Other Ambulatory Visit: Payer: Self-pay

## 2023-10-14 ENCOUNTER — Ambulatory Visit: Payer: Self-pay

## 2023-10-14 VITALS — BP 134/85 | HR 73 | Ht 61.5 in | Wt 203.4 lb

## 2023-10-14 DIAGNOSIS — E1165 Type 2 diabetes mellitus with hyperglycemia: Secondary | ICD-10-CM

## 2023-10-14 DIAGNOSIS — Z139 Encounter for screening, unspecified: Secondary | ICD-10-CM | POA: Insufficient documentation

## 2023-10-14 DIAGNOSIS — E1159 Type 2 diabetes mellitus with other circulatory complications: Secondary | ICD-10-CM

## 2023-10-14 DIAGNOSIS — R0609 Other forms of dyspnea: Secondary | ICD-10-CM

## 2023-10-14 DIAGNOSIS — R030 Elevated blood-pressure reading, without diagnosis of hypertension: Secondary | ICD-10-CM

## 2023-10-14 DIAGNOSIS — E785 Hyperlipidemia, unspecified: Secondary | ICD-10-CM

## 2023-10-14 DIAGNOSIS — I152 Hypertension secondary to endocrine disorders: Secondary | ICD-10-CM

## 2023-10-14 MED ORDER — ATORVASTATIN CALCIUM 20 MG PO TABS
20.0000 mg | ORAL_TABLET | Freq: Every day | ORAL | 3 refills | Status: DC
  Filled 2023-10-14: qty 30, 30d supply, fill #0
  Filled 2023-10-14: qty 90, 90d supply, fill #0

## 2023-10-14 NOTE — Assessment & Plan Note (Addendum)
 Episode of DOE while hiking in hot setting. Patient notes a history of asthma as a child, but never used an inhaler. Completely asymptomatic at this visit, no SOB, further episodes of DOE, CP, fever, cough. Lung exam CTAB, no need for further workup. Will reassess patient at next visit. If she continues having these episodes will consider PFTs/Albuterol inhaler.

## 2023-10-14 NOTE — Assessment & Plan Note (Addendum)
-  BP is around 135/80, goal is <130 -Discussed with pt about further lifestyle modifications vs. Starting low dose BP medication -Plan: Pt prefers to just use exercise to manage her BP. Will reassess in 3 months.

## 2023-10-14 NOTE — Assessment & Plan Note (Signed)
-  Discussed meeting with Eric for application for financial assistance.

## 2023-10-14 NOTE — Patient Instructions (Addendum)
 Thank you, Ms.Karolina JINNY Roam for allowing us  to provide your care today. Today we discussed the following:  - Continue exercising + low carbohydrate/sodium diet for your Diabetes and high blood pressure. Please record your home blood pressure readings and bring to next visit. -  Please monitor for further episodes of shortness of breath. -  We provided resources for financial assistance with Eric.  I have ordered the following labs for you:  -We will wait until the next appointment for labs after you meet with Eric for financial assistance.  Tests ordered today:    Referrals ordered today:   Referral Orders  No referral(s) requested today     I have ordered the following medication/changed the following medications:   Stop the following medications: Medications Discontinued During This Encounter  Medication Reason   atorvastatin  (LIPITOR) 20 MG tablet Reorder     Start the following medications: Meds ordered this encounter  Medications   atorvastatin  (LIPITOR) 20 MG tablet    Sig: Take 1 tablet (20 mg total) by mouth daily.    Dispense:  90 tablet    Refill:  3    IM program     Follow up: 3 months    Remember: record your blood pressure readings and bring to to next visit.  Should you have any questions or concerns please call the Internal Medicine Clinic at [phone].     Gerard Cantara, MD [facility]

## 2023-10-14 NOTE — Progress Notes (Signed)
 Internal Medicine Clinic Attending  I was physically present during the key portions of the resident provided service and participated in the medical decision making of patient's management care. I reviewed pertinent patient test results.  The assessment, diagnosis, and plan were formulated together and I agree with the documentation in the resident's note.  Debbie Clarity, MD   I think the most important thing for Debbie Strickland's health is getting her financial assistance, so we gave her the paperwork for this today. At her next visit, if she has financial assistance, consider urine microalbumin & BMP. If she has proteinuria, I would treat this with medicine.

## 2023-10-31 ENCOUNTER — Ambulatory Visit
Admission: RE | Admit: 2023-10-31 | Discharge: 2023-10-31 | Disposition: A | Discharge status: Home / Self Care | Payer: Self-pay | Source: Ambulatory Visit | Attending: Obstetrics and Gynecology | Admitting: Obstetrics and Gynecology

## 2023-10-31 ENCOUNTER — Ambulatory Visit: Payer: Self-pay | Admitting: *Deleted

## 2023-10-31 VITALS — BP 136/92 | Wt 203.0 lb

## 2023-10-31 DIAGNOSIS — Z1231 Encounter for screening mammogram for malignant neoplasm of breast: Secondary | ICD-10-CM

## 2023-10-31 DIAGNOSIS — Z01419 Encounter for gynecological examination (general) (routine) without abnormal findings: Secondary | ICD-10-CM

## 2023-10-31 NOTE — Patient Instructions (Signed)
 Explained breast self awareness with Tully Debbie Strickland. Pap smear completed today. Let her know that based on her history that her next Pap smear will be based on the result of today's Pap smear. Referred patient to the [facility] for a screening mammogram on mobile unit. Appointment scheduled Thursday, October 31, 2023 at 1500. Patient aware of appointment and will be there. Let patient know will follow up with her within the next couple weeks with results of Pap smear by letter or phone. Informed patient that the Breast Center will follow up with her within the next couple of weeks with results of her mammogram by letter or phone. Tully Debbie Gonzalez-Rico verbalized understanding.  Khushi Zupko, Wanda Ship, RN 2:52 PM

## 2023-10-31 NOTE — Progress Notes (Signed)
 Ms. Debbie Strickland is a 43 y.o. [id number] female who presents to [facility] clinic today with no complaints.    Pap Smear: Pap smear completed today. Last Pap smear was 08/30/2022 at [facility] clinic and was normal with negative HPV. 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 Patient had cryotherapy completed 03/16/2020 for follow up for abnormal colposcopy. Last Pap smear result is available in Epic.   Physical exam: Breasts Breasts symmetrical. No skin abnormalities bilateral breasts. No nipple retraction bilateral breasts. No nipple discharge bilateral breasts. No lymphadenopathy. No lumps palpated bilateral breasts. No complaints of pain or tenderness on exam.      MM LT BREAST BX W LOC DEV 1ST LESION IMAGE BX SPEC STEREO GUIDE Addendum Date: 09/24/2022 ADDENDUM REPORT: 09/24/2022 10:30 ADDENDUM: PATHOLOGY revealed: Site 1. Breast, LEFT, needle core biopsy, upper (x clip) - FIBROADENOMATOID CHANGE WITH ASSOCIATED FIBROSIS, ELASTOSIS, FOCAL CHRONIC INFLAMMATION AND CALCIFICATIONS FIBROCYSTIC CHANGES AND ADENOSIS - NEGATIVE FOR MALIGNANCY Pathology results are CONCORDANT with imaging findings, per Dr. Chyrl Phi. PATHOLOGY revealed: Site 2. Breast, LEFT, needle core biopsy, posterior outer (ribbon clip) - FIBROADENOMATOID CHANGE WITH ASSOCIATED FIBROSIS, ELASTOSIS, FOCAL CHRONIC INFLAMMATION AND CALCIFICATIONS ADENOSIS AND FIBROCYSTIC CHANGES INCLUDING APOCRINE METAPLASIA - NEGATIVE FOR MALIGNANCY Pathology results are CONCORDANT with imaging findings, per Dr. Chyrl Phi. Pathology results and recommendations below were discussed with patient by telephone on 09/24/2022, [facility] # [phone] Spanish Interpreter. Patient reported biopsy site within normal limits with slight tenderness at the site, Spanish  Interpreter Debbie Strickland [phone] was used in this interaction on 09/21/2022. Post biopsy care instructions were reviewed, questions were answered and my direct phone number was provided to patient. Patient was instructed to call [facility] if any concerns or questions arise related to the biopsy. RECOMMENDATION: The patient was instructed to return for annual May 2025 mammography. Pathology results reported by Arabella Borer, RN on 09/24/2022. Electronically Signed   By: Reyes Phi M.D.   On: 09/24/2022 10:30   Result Date: 09/24/2022 CLINICAL DATA:  43 year old female presents for tissue sampling of 2 groups of LEFT breast calcifications. Recent ultrasound-guided LEFT breast biopsies did not correspond to the calcifications identified mammographically. EXAM: LEFT BREAST STEREOTACTIC CORE NEEDLE BIOPSY COMPARISON:  Previous exam(s). FINDINGS: The patient and I discussed the procedure of stereotactic-guided biopsy including benefits and alternatives. We discussed the high likelihood of a successful procedure. We discussed the risks of the procedure including infection, bleeding, tissue injury, clip migration, and inadequate sampling. Informed written consent was given. The usual time out protocol was performed immediately prior to the procedure. LEFT BREAST STEREOTACTIC CORE NEEDLE BIOPSY #1 (0.6 cm group of coarse UPPER LEFT breast calcifications-X clip): Using sterile technique and 1% Lidocaine  with and without epinephrine as local anesthetic, under stereotactic guidance, a 9 gauge vacuum assisted device was used to perform core needle biopsy of the 0.6 cm group of coarse UPPER LEFT breast calcifications using a SUPERIOR approach. Specimen radiograph was performed showing calcifications. Specimens with calcifications are identified for pathology. At the conclusion of the procedure, an X shaped tissue marker clip was deployed into the biopsy cavity. Follow-up 2-view mammogram was performed and  dictated separately. LEFT BREAST STEREOTACTIC CORE NEEDLE BIOPSY #2 (0.3 cm group of posterior OUTER LEFT breast calcifications-RIBBON clip): Using sterile technique and 1% Lidocaine  with  and without epinephrine as local anesthetic, under stereotactic guidance, a 9 gauge vacuum assisted device was used to perform core needle biopsy of the 0.3 cm group of posterior OUTER LEFT breast calcifications using a LATERAL approach. Specimen radiograph was performed showing calcifications. Specimens with calcifications are identified for pathology. At the conclusion of the procedure, a RIBBON shaped tissue marker clip was deployed into the biopsy cavity. Follow-up 2-view mammogram was performed and dictated separately. IMPRESSION: Stereotactic-guided biopsy of 0.6 cm group of UPPER LEFT breast calcifications (X clip). Stereotactic guided biopsy of 0.3 cm group of posterior OUTER LEFT breast calcifications (RIBBON clip). No apparent complications. Electronically Signed: By: Reyes Phi M.D. On: 09/20/2022 09:46  MM LT BREAST BX W LOC DEV EA AD LESION IMG BX SPEC STEREO GUIDE Addendum Date: 09/24/2022 ADDENDUM REPORT: 09/24/2022 10:30 ADDENDUM: PATHOLOGY revealed: Site 1. Breast, LEFT, needle core biopsy, upper (x clip) - FIBROADENOMATOID CHANGE WITH ASSOCIATED FIBROSIS, ELASTOSIS, FOCAL CHRONIC INFLAMMATION AND CALCIFICATIONS FIBROCYSTIC CHANGES AND ADENOSIS - NEGATIVE FOR MALIGNANCY Pathology results are CONCORDANT with imaging findings, per Dr. Chyrl Phi. PATHOLOGY revealed: Site 2. Breast, LEFT, needle core biopsy, posterior outer (ribbon clip) - FIBROADENOMATOID CHANGE WITH ASSOCIATED FIBROSIS, ELASTOSIS, FOCAL CHRONIC INFLAMMATION AND CALCIFICATIONS ADENOSIS AND FIBROCYSTIC CHANGES INCLUDING APOCRINE METAPLASIA - NEGATIVE FOR MALIGNANCY Pathology results are CONCORDANT with imaging findings, per Dr. Chyrl Phi. Pathology results and recommendations below were discussed with patient by telephone on 09/24/2022, [facility] # [phone]  Spanish Interpreter. Patient reported biopsy site within normal limits with slight tenderness at the site, Spanish Interpreter Debbie Strickland [phone] was used in this interaction on 09/21/2022. Post biopsy care instructions were reviewed, questions were answered and my direct phone number was provided to patient. Patient was instructed to call [facility] if any concerns or questions arise related to the biopsy. RECOMMENDATION: The patient was instructed to return for annual May 2025 mammography. Pathology results reported by Arabella Borer, RN on 09/24/2022. Electronically Signed   By: Reyes Phi M.D.   On: 09/24/2022 10:30   Result Date: 09/24/2022 CLINICAL DATA:  43 year old female presents for tissue sampling of 2 groups of LEFT breast calcifications. Recent ultrasound-guided LEFT breast biopsies did not correspond to the calcifications identified mammographically. EXAM: LEFT BREAST STEREOTACTIC CORE NEEDLE BIOPSY COMPARISON:  Previous exam(s). FINDINGS: The patient and I discussed the procedure of stereotactic-guided biopsy including benefits and alternatives. We discussed the high likelihood of a successful procedure. We discussed the risks of the procedure including infection, bleeding, tissue injury, clip migration, and inadequate sampling. Informed written consent was given. The usual time out protocol was performed immediately prior to the procedure. LEFT BREAST STEREOTACTIC CORE NEEDLE BIOPSY #1 (0.6 cm group of coarse UPPER LEFT breast calcifications-X clip): Using sterile technique and 1% Lidocaine  with and without epinephrine as local anesthetic, under stereotactic guidance, a 9 gauge vacuum assisted device was used to perform core needle biopsy of the 0.6 cm group of coarse UPPER LEFT breast calcifications using a SUPERIOR approach. Specimen radiograph was performed showing calcifications. Specimens with calcifications are identified for pathology. At the conclusion of the procedure, an  X shaped tissue marker clip was deployed into the biopsy cavity. Follow-up 2-view mammogram was performed and dictated separately. LEFT BREAST STEREOTACTIC CORE NEEDLE BIOPSY #2 (0.3 cm group of posterior OUTER LEFT breast calcifications-RIBBON clip): Using sterile technique and 1% Lidocaine  with and without epinephrine as local anesthetic, under stereotactic guidance, a 9 gauge vacuum assisted device was used to perform core needle biopsy of the  0.3 cm group of posterior OUTER LEFT breast calcifications using a LATERAL approach. Specimen radiograph was performed showing calcifications. Specimens with calcifications are identified for pathology. At the conclusion of the procedure, a RIBBON shaped tissue marker clip was deployed into the biopsy cavity. Follow-up 2-view mammogram was performed and dictated separately. IMPRESSION: Stereotactic-guided biopsy of 0.6 cm group of UPPER LEFT breast calcifications (X clip). Stereotactic guided biopsy of 0.3 cm group of posterior OUTER LEFT breast calcifications (RIBBON clip). No apparent complications. Electronically Signed: By: Reyes Phi M.D. On: 09/20/2022 09:46  MM CLIP PLACEMENT LEFT Result Date: 09/20/2022 CLINICAL DATA:  Evaluate placement of X and RIBBON biopsy clips following stereotactic guided LEFT breast biopsies. EXAM: 3D DIAGNOSTIC LEFT MAMMOGRAM POST STEREOTACTIC BIOPSY COMPARISON:  Previous exam(s). FINDINGS: 3D Mammographic images were obtained following stereotactic guided biopsy of 0.6 cm group of UPPER LEFT breast calcifications (X clip) and 0.3 cm group of posterior OUTER LEFT breast calcifications (RIBBON clip). The X biopsy marking clip is located 0.5 cm INFERIOR to residual calcifications within the UPPER LEFT breast. The RIBBON biopsy marking clip is located 5 mm anterolateral to the residual calcifications within the posterior OUTER LEFT breast IMPRESSION: 0.5 cm INFERIOR migration of the X biopsy marking clip in the UPPER LEFT breast. Residual  calcifications can be targeted for localization if required. 0.5 cm anterolateral migration of the RIBBON biopsy marking clip in the posterior OUTER LEFT breast. Residual calcifications can be targeted for localization if required. Final Assessment: Post Procedure Mammograms for Marker Placement Electronically Signed   By: Reyes Phi M.D.   On: 09/20/2022 09:54  MM CLIP PLACEMENT LEFT Result Date: 09/14/2022 CLINICAL DATA:  Confirmation clip placement after ultrasound-guided core needle biopsy of 2 LEFT breast masses. EXAM: 2D and 3D DIAGNOSTIC LEFT MAMMOGRAM POST ULTRASOUND BIOPSY COMPARISON:  Previous exam(s). FINDINGS: 2D and 3D full field CC and mediolateral mammographic images were obtained following ultrasound guided biopsy of 2 calcified LEFT breast masses. The coil shaped tissue marking clip is positioned just anterior to the calcified mass in the UPPER INNER QUADRANT at 11 o'clock; it is unclear if any of the calcifications were removed. The Venus shaped tissue marking clip is approximately 4 cm anterior to the calcified mass in the outer breast at 3 o'clock, indicating that the mass identified on ultrasound is not the sonographic correlate. Expected post biopsy changes are present at each site without evidence of hematoma. IMPRESSION: 1. The coil shaped tissue marking clip is immediately anterior to the calcified mass in the UPPER INNER QUADRANT at 11 o'clock; however, it is unclear if any of the calcifications were removed. 2. The Venus shaped tissue marking clip is approximately 4 cm anterior to the calcified mass in the outer breast at 3 o'clock, indicating that the mass identified on ultrasound is not the sonographic correlate for the mammographic mass. 3. Stereotactic tomosynthesis core needle biopsy of the mass in the outer breast at 3 o'clock will be necessary. If no calcifications are seen on pathology of the mass in the UPPER INNER QUADRANT at 11 o'clock, stereotactic tomosynthesis core needle  biopsy of this mass will also be necessary. Final Assessment: Post Procedure Mammograms for Marker Placement Electronically Signed   By: Debby Satterfield M.D.   On: 09/14/2022 16:56  MS DIGITAL DIAG UNI LEFT Result Date: 09/13/2022 CLINICAL DATA:  Recalled from screening for left breast calcifications. EXAM: DIGITAL DIAGNOSTIC UNILATERAL LEFT MAMMOGRAM WITH CAD; ULTRASOUND LEFT BREAST LIMITED TECHNIQUE: Left digital diagnostic mammography was performed. ; Targeted  ultrasound examination of the left breast was performed. COMPARISON:  Previous exam(s). ACR Breast Density Category c: The breasts are heterogeneously dense, which may obscure small masses. FINDINGS: Within the central superior left breast middle depth there is a small lobular mass with associated calcifications. Additionally within the outer left breast posterior depth there is a small lobular mass with associated calcifications. Targeted ultrasound is performed, showing a 4 x 4 x 6 mm irregular hypoechoic mass left breast 11 o'clock position 4 cm from the nipple with central calcifications. There is a 5 x 3 x 2 mm lobular oval mass left breast 3 o'clock position 8 cm from nipple with central calcifications. No left axillary adenopathy. IMPRESSION: Indeterminate left breast mass 11 o'clock position. Indeterminate left breast mass 3 o'clock position. RECOMMENDATION: Ultrasound-guided core needle biopsy left breast mass 11 o'clock position and left breast mass 3 o'clock position. I have discussed the findings and recommendations with the patient. If applicable, a reminder letter will be sent to the patient regarding the next appointment. BI-RADS CATEGORY  4: Suspicious. Electronically Signed   By: Bard Moats M.D.   On: 09/13/2022 10:53  MS 3D SCR MAMMO BILAT BR (aka MM) Result Date: 09/04/2022 CLINICAL DATA:  Screening. EXAM: DIGITAL SCREENING BILATERAL MAMMOGRAM WITH TOMOSYNTHESIS AND CAD TECHNIQUE: Bilateral screening digital craniocaudal and  mediolateral oblique mammograms were obtained. Bilateral screening digital breast tomosynthesis was performed. The images were evaluated with computer-aided detection. COMPARISON:  Previous exam(s). ACR Breast Density Category c: The breasts are heterogeneously dense, which may obscure small masses. FINDINGS: In the left breast, calcifications warrant further evaluation. In the right breast, no findings suspicious for malignancy. IMPRESSION: Further evaluation is suggested for calcifications in the left breast. RECOMMENDATION: Diagnostic mammogram of the left breast. (Code:[id number]) The patient will be contacted regarding the findings, and additional imaging will be scheduled. BI-RADS CATEGORY  0: Incomplete: Need additional imaging evaluation. Electronically Signed   By: Almarie Daring M.D.   On: 09/04/2022 09:42    Pelvic/Bimanual Ext Genitalia No lesions, no swelling and no discharge observed on external genitalia.        Vagina Vagina pink and normal texture. No lesions or discharge observed in vagina.        Cervix Cervix is present. Cervix pink and of normal texture. No discharge observed.    Uterus Uterus is present and palpable. Uterus in normal position and normal size.        Adnexae Bilateral ovaries present and palpable. No tenderness on palpation.         Rectovaginal No rectal exam completed today since patient had no rectal complaints. No skin abnormalities observed on exam.     Smoking History: Patient has never smoked.   Patient Navigation: Patient education provided. Access to services provided for patient through [place] program. Spanish interpreter Bernice Angry from [facility] provided.    Breast and Cervical Cancer Risk Assessment: Patient does not have family history of breast cancer, known genetic mutations, or radiation treatment to the chest before age 43. Patient has history of cervical dysplasia. Patient has no history of being immunocompromised or DES exposure  in-utero.  Risk Scores as of Encounter on 10/31/2023     Alisa           5-year 1.87%   Lifetime 15.11%   This patient is [place]/Latina but has no documented birth country, so the [place] model used data from [place] patients to calculate their risk score. Document a birth country in the Demographics activity for  a more accurate score.         Last calculated by Silas, Ansyi K, CMA on 10/31/2023 at  2:46 PM        A: B[facility]CP exam with pap smear No complaints.  P: Referred patient to the [facility] for a screening mammogram on mobile unit. Appointment scheduled Thursday, October 31, 2023 at 1500.  Driscilla Wanda SQUIBB, RN 10/31/2023 2:52 PM

## 2023-11-07 ENCOUNTER — Ambulatory Visit: Payer: Self-pay

## 2023-11-07 LAB — CYTOLOGY - PAP
Comment: NEGATIVE
Diagnosis: NEGATIVE
High risk HPV: NEGATIVE

## 2023-12-25 ENCOUNTER — Ambulatory Visit: Payer: Self-pay

## 2023-12-25 ENCOUNTER — Encounter: Payer: Self-pay | Admitting: Emergency Medicine

## 2023-12-25 ENCOUNTER — Ambulatory Visit
Admission: EM | Admit: 2023-12-25 | Discharge: 2023-12-25 | Disposition: A | Discharge status: Home / Self Care | Payer: Self-pay | Attending: Nurse Practitioner | Admitting: Nurse Practitioner

## 2023-12-25 DIAGNOSIS — R002 Palpitations: Secondary | ICD-10-CM

## 2023-12-25 LAB — POCT URINE DIPSTICK
Bilirubin, UA: NEGATIVE
Glucose, UA: NEGATIVE mg/dL
Ketones, POC UA: NEGATIVE mg/dL
Leukocytes, UA: NEGATIVE
Nitrite, UA: NEGATIVE
POC PROTEIN,UA: NEGATIVE
Spec Grav, UA: 1.01 (ref 1.010–1.025)
Urobilinogen, UA: 0.2 U/dL
pH, UA: 6 (ref 5.0–8.0)

## 2023-12-25 NOTE — Telephone Encounter (Signed)
 FYI Only or Action Required?: FYI only for provider.  Patient was last seen in primary care on 10/14/2023 by Waymond Cart, MD.  Called Nurse Triage reporting Chest Pain.  Symptoms began several weeks ago.  Interventions attempted: Rest, hydration, or home remedies.  Symptoms are: unchanged.  Triage Disposition: Go to ED Now (or PCP Triage)  Patient/caregiver understands and will follow disposition?: YesCopied from CRM [id number]. Topic: Clinical - Red Word Triage >> Dec 25, 2023  2:37 PM Cherylann RAMAN wrote: Red Word that prompted transfer to Nurse Triage: Patient called in with complaints of fast heart rate. She states that this has been going on now for about 2 weeks. Patient initially thought it was due to exercising but the fast heart rate continued well after the exercises was complete. Patient has a history of hypertension. Patient states she a little of pressure in her chest. Reason for Disposition  [1] Chest pain lasts > 5 minutes AND [2] occurred in past 3 days (72 hours) (Exception: Feels exactly the same as previously diagnosed heartburn and has accompanying sour taste in mouth.)  Answer Assessment - Initial Assessment Questions Pt complaining of left-sided chest pressure and feels heart is racing. Pt has some SOB at times. RN advised ED and to call back and make follow up appt. Interpretor ID [id number]    1. LOCATION: Where does it hurt?       Left side of chest 2. RADIATION: Does the pain go anywhere else? (e.g., into neck, jaw, arms, back)     denies 3. ONSET: When did the chest pain begin? (Minutes, hours or days)      2 weeks ago 4. PATTERN: Does the pain come and go, or has it been constant since it started?  Does it get worse with exertion?      Comes and goes with exertion  5. DURATION: How long does it last (e.g., seconds, minutes, hours)     2-3 mins  6. SEVERITY: How bad is the pain?  (e.g., Scale 1-10; mild, moderate, or severe)     4 7. CARDIAC RISK  FACTORS: Do you have any history of heart problems or risk factors for heart disease? (e.g., angina, prior heart attack; diabetes, high blood pressure, high cholesterol, smoker, or strong family history of heart disease)     High blood pressure 8. PULMONARY RISK FACTORS: Do you have any history of lung disease?  (e.g., blood clots in lung, asthma, emphysema, birth control pills)     denies 9. CAUSE: What do you think is causing the chest pain?     Not sure 10. OTHER SYMPTOMS: Do you have any other symptoms? (e.g., dizziness, nausea, vomiting, sweating, fever, difficulty breathing, cough)       Heart racing  Protocols used: Chest Pain-A-AH

## 2023-12-25 NOTE — ED Triage Notes (Addendum)
 Pt presents due to feeling of have palpitations and rapid heart rate. States this first happened a few month ago after drinking coffee but it went away and would come back with coffee. Recently she drank coffee about 2 weeks ago and symptoms have been consistent. She has not drank coffee since and is still having palpitations.   Denies chest pain  Pt states she started taking Lipitor about 2-3 months ago

## 2023-12-25 NOTE — Discharge Instructions (Addendum)
 Usted fue atendido [employer] por palpitaciones que han estado ocurriendo diariamente durante las [address], a veces con [place] y a Lexicographer. Su EKG de hoy fue normal, lo cual es tranquilizador. Debido a su historial de prediabetes y presin arterial alta, estamos realizando ms estudios, incluyendo pruebas de la tiroides, anlisis de [place] y un examen de comoros, para ayudar a descartar causas como cambios metablicos u hormonales. Solo ser notificado si alguno de sus resultados es anormal; de lo contrario, puede revisar los [place] en su cuenta de [place].  Por ahora, mantngase bien hidratado, limite la cafena y el alcohol, y asegrese de comer comidas regulares y balanceadas, ya que la deshidratacin y el bajo nivel de azcar en la sangre pueden empeorar las palpitaciones. Trate de observar cundo ocurren las palpitaciones y anote cualquier sntoma como mareo o visin borrosa, ya que esta informacin ser til en su seguimiento.  Si los resultados de sus pruebas son normales y los sntomas [place], debe dar seguimiento con su mdico de atencin primaria. l o ella puede derivarlo a un cardilogo y Immunologist pruebas adicionales como un monitor Holter para registrar su ritmo cardaco durante un perodo de tiempo ms [place].  Vaya a la sala de emergencias de inmediato si desarrolla dolor en el pecho, dificultad para respirar, desmayo, sudoracin, mareo intenso o cualquier cambio repentino en sus sntomas.  You were seen today for palpitations that have been happening daily over the past two weeks, sometimes with activity and sometimes at rest. Your EKG today was normal, which is reassuring. Because you have a history of prediabetes and high blood pressure, we are doing further tests, including thyroid studies, blood work, and a urine test, to help rule out causes such as metabolic or hormonal changes. You will be only be notified if any of your test results are abnormal, otherwise, you can  review results on your MyChart. For now, stay well hydrated, limit caffeine and alcohol, and make sure you are eating regular balanced meals, as dehydration and low blood sugar can sometimes make palpitations worse. Try to monitor when the palpitations occur and keep track of any symptoms such as dizziness or blurred vision, as this information will help at follow-up. If your test results are normal and symptoms continue, you should follow up with your primary care provider. They may refer you to a cardiologist and order additional testing such as a Holter monitor to record your heart rhythm over a longer period of time. Go to the emergency room immediately if you develop chest pain, shortness of breath, fainting, sweating, severe dizziness, or any sudden change in your symptoms.

## 2023-12-25 NOTE — ED Provider Notes (Signed)
 [facility]    CSN: [id number] Arrival date & time: 12/25/23  1600      History   Chief Complaint No chief complaint on file.   HPI Debbie Strickland is a 43 y.o. female.   Patient is Spanish-speaking, declines our translation services and prefers that he daughter translate for this encounter.   Discussed the use of AI scribe software for clinical note transcription with the patient, who gave verbal consent to proceed.   The patient is a female with a history of prediabetes, hypertension, and hyperlipidemia presenting with palpitations for approximately 2 weeks. The palpitations occur daily, primarily when the patient increases her physical activity. She reports occasional dizziness and mild blurry vision as well. The patient denies shortness of breath, wheezing, pain, swelling in extremities, numbness or tingling of face or extremities, speech problems, weakness, nausea, vomiting, or light sensitivity. The patient was diagnosed with diabetes in June 2024, with a recent A1c of 6.2%. She has been managing her blood pressure through exercise and walking for about an hour daily. In July 2025, she had a follow-up with her PCP, where starting a low-dose blood pressure medication was discussed, but her blood pressure was within acceptable limits at that time and opted to continue with lifestyle management with recheck in 3 months. The patient is currently taking a low dose of atorvastatin  for hyperlipidemia, with her LDL cholesterol reported to be at goal. She also takes multivitamins. Drinks coffee but not in excess. Denies consumption of energy drinks or soda.   The following sections of the patient's history were reviewed and updated as appropriate: allergies, current medications, past family history, past medical history, past social history, past surgical history, and problem list.           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    Past Surgical History:  Procedure Laterality Date   BREAST BIOPSY Left 09/14/2022   US  LT BREAST BX W LOC DEV 1ST LESION IMG BX SPEC US  GUIDE 09/14/2022 [facility]   BREAST BIOPSY Left 09/14/2022   US  LT BREAST BX W LOC DEV EA ADD LESION IMG BX SPEC US  GUIDE 09/14/2022 [facility]   BREAST BIOPSY Left 09/20/2022   MM LT BREAST BX W LOC DEV 1ST LESION IMAGE BX SPEC STEREO GUIDE 09/20/2022 [facility]   BREAST BIOPSY Left 09/20/2022   MM LT BREAST BX W LOC DEV EA AD LESION IMG BX SPEC STEREO GUIDE 09/20/2022 [facility]   NO PAST SURGERIES      OB History     Gravida  3   Para  3   Term  3   Preterm      AB      Living  3      SAB      IAB      Ectopic      Multiple  0   Live Births  3  Home Medications    Prior to Admission medications   Medication Sig Start Date End Date Taking? Authorizing Provider  atorvastatin  (LIPITOR) 20 MG tablet Take 1 tablet (20 mg total) by mouth daily. 10/14/23   Tan, Dawson, MD  Cholecalciferol (VITAMIN D3) 50 MCG (2000 UT) capsule Take 1 capsule (2,000 Units total) by mouth daily. Patient not taking: Reported on 10/31/2023 08/12/19   Izell Harari, MD  ibuprofen  (ADVIL ) 200 MG tablet Take 200 mg by mouth every 6 (six) hours as needed for mild pain (pain score 1-3).    [provider]  Multiple Vitamin (MULTIVITAMIN) tablet Take 1 tablet by mouth daily.    [provider]   norethindrone -ethinyl estradiol -FE (LOESTRIN  FE) 1-20 MG-MCG tablet Take 1 tablet by mouth daily. Patient not taking: Reported on 09/13/2023 01/30/23   Masters, Izetta, DO    Family History Family History  Problem Relation Age of Onset   Diabetes Mother    Hypertension Mother    Heart disease Mother    Lung cancer Father    Breast cancer Neg Hx     Social History Social History   Tobacco Use   Smoking status: Never   Smokeless tobacco: Never  Vaping Use   Vaping status: Never Used  Substance Use Topics   Alcohol use: Not Currently    Comment: rarely   Drug use: Never     Allergies   Patient has no known allergies.   Review of Systems Review of Systems  Eyes:  Negative for photophobia and visual disturbance (sometimes has some blurred vision but mild).  Respiratory:  Negative for shortness of breath.   Cardiovascular:  Positive for palpitations. Negative for chest pain and leg swelling.  Gastrointestinal:  Negative for nausea and vomiting.  Neurological:  Positive for dizziness (sometimes but mild) and headaches (sometimes but mild). Negative for speech difficulty, weakness and numbness.  All other systems reviewed and are negative.    Physical Exam Triage Vital Signs ED Triage Vitals  Encounter Vitals Group     BP 12/25/23 1621 133/84     Girls Systolic BP Percentile --      Girls Diastolic BP Percentile --      Boys Systolic BP Percentile --      Boys Diastolic BP Percentile --      Pulse Rate 12/25/23 1621 81     Resp 12/25/23 1621 17     Temp 12/25/23 1621 98.7 F (37.1 C)     Temp Source 12/25/23 1621 Oral     SpO2 12/25/23 1621 96 %     Weight --      Height --      Head Circumference --      Peak Flow --      Pain Score 12/25/23 1625 0     Pain Loc --      Pain Education --      Exclude from Growth Chart --    No data found.  Updated Vital Signs BP 133/84 (BP Location: Right Arm)   Pulse 81   Temp 98.7 F (37.1 C) (Oral)   Resp 17   LMP  12/06/2023 (Approximate)   SpO2 96%   Visual Acuity Right Eye Distance:   Left Eye Distance:   Bilateral Distance:    Right Eye Near:   Left Eye Near:    Bilateral Near:     Physical Exam Vitals reviewed.  Constitutional:      General: She is awake. She is not  in acute distress.    Appearance: Normal appearance. She is well-developed. She is not ill-appearing, toxic-appearing or diaphoretic.  HENT:     Head: Normocephalic.     Right Ear: Hearing normal.     Left Ear: Hearing normal.     Nose: Nose normal.     Mouth/Throat:     Mouth: Mucous membranes are moist.  Eyes:     General: Vision grossly intact.     Conjunctiva/sclera: Conjunctivae normal.  Cardiovascular:     Rate and Rhythm: Normal rate and regular rhythm.     Pulses: Normal pulses.     Heart sounds: Normal heart sounds.  Pulmonary:     Effort: Pulmonary effort is normal.     Breath sounds: Normal breath sounds and air entry.  Chest:     Chest wall: No tenderness.  Abdominal:     Palpations: Abdomen is soft.  Musculoskeletal:        General: Normal range of motion.     Cervical back: Normal range of motion and neck supple.     Right lower leg: No edema.     Left lower leg: No edema.  Skin:    General: Skin is warm and dry.  Neurological:     General: No focal deficit present.     Mental Status: She is alert and oriented to person, place, and time.     Sensory: Sensation is intact.     Motor: Motor function is intact.     Coordination: Coordination is intact.     Gait: Gait is intact.  Psychiatric:        Mood and Affect: Mood and affect normal.        Speech: Speech normal.        Behavior: Behavior is cooperative.      UC Treatments / Results  Labs (all labs ordered are listed, but only abnormal results are displayed) Labs Reviewed  POCT URINE DIPSTICK - Abnormal; Notable for the following components:      Result Value   Blood, UA small (*)    All other components within normal limits   COMPREHENSIVE METABOLIC PANEL WITH GFR  TSH  T4    EKG   Radiology No results found.  Procedures ED EKG  Date/Time: 12/25/2023 5:49 PM  Performed by: Iola Lukes, FNP Authorized by: Iola Lukes, FNP   Rate:    ECG rate:  88   ECG rate assessment: normal   Rhythm:    Rhythm: sinus rhythm   Ectopy:    Ectopy: none   QRS:    QRS axis:  Normal   QRS intervals:  Normal   QRS conduction: normal   ST segments:    ST segments:  Normal T waves:    T waves: normal   Q waves:    Abnormal Q-waves: not present   Other findings:    Other findings comment:  Borderline prolonged QTc  (including critical care time)  Medications Ordered in UC Medications - No data to display  Initial Impression / Assessment and Plan / UC Course  I have reviewed the triage vital signs and the nursing notes.  Pertinent labs & imaging results that were available during my care of the patient were reviewed by me and considered in my medical decision making (see chart for details).     The patient reports daily palpitations over the past two weeks, occurring with activity and occasionally at rest. Symptoms are associated with intermittent dizziness and mild  blurred vision but without chest pain, shortness of breath, lower extremity swelling, or neurological deficits. EKG obtained today was normal. UA unremarkable. Given the patient's history of prediabetes and hypertension, further evaluation is indicated to exclude metabolic or cardiac causes. Thyroid function tests and comprehensive metabolic panel were ordered, and results will be reviewed once available. If testing is unremarkable and symptoms continue, the patient was advised to follow up with primary care for further evaluation, including possible cardiology referral and Holter monitoring. Emergency precautions were discussed, including seeking immediate care for chest pain, shortness of breath, diaphoresis, or other concerning  symptoms.  Today's evaluation has revealed no signs of a dangerous process. Discussed diagnosis with patient and/or guardian. Patient and/or guardian aware of their diagnosis, possible red flag symptoms to watch out for and need for close follow up. Patient and/or guardian understands verbal and written discharge instructions. Patient and/or guardian comfortable with plan and disposition.  Patient and/or guardian has a clear mental status at this time, good insight into illness (after discussion and teaching) and has clear judgment to make decisions regarding their care  Documentation was completed with the aid of voice recognition software. Transcription may contain typographical errors.  Final Clinical Impressions(s) / UC Diagnoses   Final diagnoses:  Palpitations     Discharge Instructions      Usted fue atendido hoy por palpitaciones que han estado ocurriendo diariamente durante las [address], a veces con [place] y a Lexicographer. Su EKG de hoy fue normal, lo cual es tranquilizador. Debido a su historial de prediabetes y presin arterial alta, estamos realizando ms estudios, incluyendo pruebas de la tiroides, anlisis de [place] y un examen de comoros, para ayudar a descartar causas como cambios metablicos u hormonales. Solo ser notificado si alguno de sus resultados es anormal; de lo contrario, puede revisar los [place] en su cuenta de [place].  Por ahora, mantngase bien hidratado, limite la cafena y el alcohol, y asegrese de comer comidas regulares y balanceadas, ya que la deshidratacin y el bajo nivel de azcar en la sangre pueden empeorar las palpitaciones. Trate de observar cundo ocurren las palpitaciones y anote cualquier sntoma como mareo o visin borrosa, ya que esta informacin ser til en su seguimiento.  Si los resultados de sus pruebas son normales y los sntomas [place], debe dar seguimiento con su mdico de atencin primaria. l o ella puede derivarlo  a un cardilogo y Immunologist pruebas adicionales como un monitor Holter para registrar su ritmo cardaco durante un perodo de tiempo ms [place].  Vaya a la sala de emergencias de inmediato si desarrolla dolor en el pecho, dificultad para respirar, desmayo, sudoracin, mareo intenso o cualquier cambio repentino en sus sntomas.  You were seen today for palpitations that have been happening daily over the past two weeks, sometimes with activity and sometimes at rest. Your EKG today was normal, which is reassuring. Because you have a history of prediabetes and high blood pressure, we are doing further tests, including thyroid studies, blood work, and a urine test, to help rule out causes such as metabolic or hormonal changes. You will be only be notified if any of your test results are abnormal, otherwise, you can review results on your MyChart. For now, stay well hydrated, limit caffeine and alcohol, and make sure you are eating regular balanced meals, as dehydration and low blood sugar can sometimes make palpitations worse. Try to monitor when the palpitations occur and keep track of any symptoms such as dizziness or  blurred vision, as this information will help at follow-up. If your test results are normal and symptoms continue, you should follow up with your primary care provider. They may refer you to a cardiologist and order additional testing such as a Holter monitor to record your heart rhythm over a longer period of time. Go to the emergency room immediately if you develop chest pain, shortness of breath, fainting, sweating, severe dizziness, or any sudden change in your symptoms.     ED Prescriptions   None    PDMP not reviewed this encounter.   Iola Lukes, [place] 12/26/23 0830

## 2023-12-30 NOTE — Telephone Encounter (Signed)
 Name: Debbie Strickland, Debbie Strickland MRN: [id number]  Date: 01/07/2024 Status: Sch  Time: 2:15 PM Length: 30  Visit Type: OFFICE VISIT [8002] Copay: $0.00  Provider: Elicia Sharper, DO      Copied from CRM [id number]. Topic: Appointments - Appointment Scheduling >> Dec 26, 2023  1:59 PM Diannia H wrote: Patient/patient representative is calling to schedule an appointment for a hospital follow up. Its giving me date pass the 14 days. Could you assist? Patients callback number is [phone].

## 2023-12-31 ENCOUNTER — Ambulatory Visit (HOSPITAL_COMMUNITY): Payer: Self-pay

## 2023-12-31 LAB — COMPREHENSIVE METABOLIC PANEL WITH GFR
ALT: 21 IU/L (ref 0–32)
AST: 24 IU/L (ref 0–40)
Albumin: 4.7 g/dL (ref 3.9–4.9)
Alkaline Phosphatase: 59 IU/L (ref 41–116)
BUN/Creatinine Ratio: 17 (ref 9–23)
BUN: 8 mg/dL (ref 6–24)
Bilirubin Total: 0.2 mg/dL (ref 0.0–1.2)
CO2: 23 mmol/L (ref 20–29)
Calcium: 9.6 mg/dL (ref 8.7–10.2)
Chloride: 102 mmol/L (ref 96–106)
Creatinine, Ser: 0.47 mg/dL — ABNORMAL LOW (ref 0.57–1.00)
Globulin, Total: 2.7 g/dL (ref 1.5–4.5)
Glucose: 88 mg/dL (ref 70–99)
Potassium: 4.2 mmol/L (ref 3.5–5.2)
Sodium: 139 mmol/L (ref 134–144)
Total Protein: 7.4 g/dL (ref 6.0–8.5)
eGFR: 121 mL/min/1.73 (ref >59)

## 2023-12-31 LAB — TSH: TSH: 3.85 u[IU]/mL (ref 0.450–4.500)

## 2023-12-31 LAB — SPECIMEN STATUS REPORT

## 2023-12-31 LAB — T4: T4, Total: 8.2 ug/dL (ref 4.5–12.0)

## 2024-01-07 ENCOUNTER — Ambulatory Visit: Payer: Self-pay | Admitting: Student

## 2024-01-07 ENCOUNTER — Ambulatory Visit: Payer: Self-pay | Attending: Internal Medicine

## 2024-01-07 ENCOUNTER — Encounter: Payer: Self-pay | Admitting: Student

## 2024-01-07 VITALS — BP 128/66 | HR 75 | Temp 98.9°F | Ht 61.5 in | Wt 198.6 lb

## 2024-01-07 DIAGNOSIS — R002 Palpitations: Secondary | ICD-10-CM

## 2024-01-07 DIAGNOSIS — E1159 Type 2 diabetes mellitus with other circulatory complications: Secondary | ICD-10-CM

## 2024-01-07 DIAGNOSIS — E1165 Type 2 diabetes mellitus with hyperglycemia: Secondary | ICD-10-CM

## 2024-01-07 DIAGNOSIS — E785 Hyperlipidemia, unspecified: Secondary | ICD-10-CM

## 2024-01-07 DIAGNOSIS — I152 Hypertension secondary to endocrine disorders: Secondary | ICD-10-CM

## 2024-01-07 DIAGNOSIS — Z23 Encounter for immunization: Secondary | ICD-10-CM

## 2024-01-07 NOTE — Assessment & Plan Note (Signed)
 Last A1c of 6.2 in 09/2023.  Diet controlled. Has CAFA for financial assistance.   Plan -Continue lifestyle modifications including physical activity and healthy eating -A1c every 6 months -Ordered urine ACR today -Referral to ophthalmology -LDL 99 in 09/2023, on atorvastatin  20 mg

## 2024-01-07 NOTE — Progress Notes (Signed)
 CC: ED f/u  HPI: [name] is a 43 y.o. female living with a history stated below and presents today for ED f/u. Please see problem based assessment and plan for additional details.  Virtual Spanish interpreter present during this encounter.  Past Medical History:  Diagnosis Date   BMI 40.0-44.9, adult (HCC) 08/12/2019   Diabetes (HCC)    HSIL (high grade squamous intraepithelial lesion) on Pap smear of cervix     Current Outpatient Medications on File Prior to Visit  Medication Sig Dispense Refill   atorvastatin  (LIPITOR) 20 MG tablet Take 1 tablet (20 mg total) by mouth daily. 90 tablet 3   Cholecalciferol (VITAMIN D3) 50 MCG (2000 UT) capsule Take 1 capsule (2,000 Units total) by mouth daily. (Patient not taking: Reported on 10/31/2023) 30 capsule 5   ibuprofen  (ADVIL ) 200 MG tablet Take 200 mg by mouth every 6 (six) hours as needed for mild pain (pain score 1-3).     Multiple Vitamin (MULTIVITAMIN) tablet Take 1 tablet by mouth daily.     norethindrone -ethinyl estradiol -FE (LOESTRIN  FE) 1-20 MG-MCG tablet Take 1 tablet by mouth daily. (Patient not taking: Reported on 09/13/2023) 28 tablet 11   No current facility-administered medications on file prior to visit.    Family History  Problem Relation Age of Onset   Diabetes Mother    Hypertension Mother    Heart disease Mother    Lung cancer Father    Breast cancer Neg Hx     Social History   Socioeconomic History   Marital status: Married    Spouse name: Ezequiel   Number of children: 3   Years of education: Not on file   Highest education level: Not on file  Occupational History   Not on file  Tobacco Use   Smoking status: Never   Smokeless tobacco: Never  Vaping Use   Vaping status: Never Used  Substance and Sexual Activity   Alcohol use: Not Currently    Comment: rarely   Drug use: Never   Sexual activity: Yes    Birth control/protection: None  Other Topics Concern   Not on file  Social  History Narrative   Not on file   Social Drivers of Health   Financial Resource Strain: Not on file  Food Insecurity: No Food Insecurity (10/31/2023)   Hunger Vital Sign    Worried About Running Out of Food in the Last Year: Never true    Ran Out of Food in the Last Year: Never true  Transportation Needs: No Transportation Needs (10/31/2023)   PRAPARE - Administrator, Civil Service (Medical): No    Lack of Transportation (Non-Medical): No  Physical Activity: Not on file  Stress: Not on file  Social Connections: Not on file  Intimate Partner Violence: Not on file    Review of Systems: ROS negative except for what is noted on the assessment and plan.  Vitals:   01/07/24 1423  BP: 128/66  Pulse: 75  Temp: 98.9 F (37.2 C)  TempSrc: Oral  SpO2: 99%  Weight: 198 lb 9.6 oz (90.1 kg)  Height: 5' 1.5 (1.562 m)   Physical Exam: Constitutional: well-appearing female sitting in chair comfortably, in no acute distress Cardiovascular: regular rate and rhythm, no murmur noted Pulmonary/Chest: normal work of breathing on room air, lungs clear to auscultation bilaterally Neurological: alert & oriented x 3 Skin: warm and dry  Assessment & Plan:   Assessment & Plan Palpitations For ED visit on  9/17.  Reports at least 1 month of heart palpitations that she has noticed.  Denies chest pain, dyspnea, syncope or LOC, vision changes or leg swelling.  Palpitations have been at random times.  Reports increase in exercise activity recently.  Labs done at ED showed normal TSH and CMP.  EKG at that time showed sinus rhythm with PVC.  No arrhythmia noted then. Patient states she stopped her atorvastatin  and reports decreased recurrence of palpitations.  No other history of cardiac disease.  Cardiovascular exam today with regular rate and rhythm and no murmurs appreciated.  Of note, she now has CAFA for financial assistance.  Plan -Zio patch for 2 weeks -Plan follow-up in 6-8  weeks Type 2 diabetes mellitus with hyperglycemia, without long-term current use of insulin (HCC) Last A1c of 6.2 in 09/2023.  Diet controlled. Has CAFA for financial assistance.   Plan -Continue lifestyle modifications including physical activity and healthy eating -A1c every 6 months -Ordered urine ACR today -Referral to ophthalmology -LDL 99 in 09/2023, on atorvastatin  20 mg Hypertension associated with diabetes (HCC) BP 120/66 today.  Not on antihypertensives.  Continue with lifestyle modifications. Hyperlipidemia, unspecified hyperlipidemia type LDL 99 on 09/2023.  Prescribed atorvastatin  20 mg daily.  Patient did stop this medication in the setting of palpitations and reports decreased occurrence.  Shared decision making with decision to restart atorvastatin  and monitor for symptoms.  Encounter for immunization Received flu shot today.  Orders Placed This Encounter  Procedures   Flu vaccine trivalent PF, 6mos and older(Flulaval,Afluria,Fluarix,Fluzone)   Microalbumin / Creatinine Urine Ratio   Ambulatory referral to Ophthalmology   LONG TERM MONITOR XT (3-14 DAYS)   Return in about 6 weeks (around 02/18/2024) for seguimiento de las palpitaciones (follow up on palpitations).   Patient discussed with Dr. Machen  Samyria Rudie, D.O. [facility] Internal Medicine, PGY-3 Phone: [phone] Date 01/07/2024 Time 5:21 PM

## 2024-01-07 NOTE — Progress Notes (Unsigned)
 EP to read.

## 2024-01-07 NOTE — Assessment & Plan Note (Signed)
 BP 120/66 today.  Not on antihypertensives.  Continue with lifestyle modifications.

## 2024-01-07 NOTE — Patient Instructions (Addendum)
 Gracias, Sra. Debbie Strickland, por permitirnos atenderle hoy. Hoy hablamos sobre:  - Se le solicit un monitor cardaco y Librarian, academic. selo durante 2 semanas y devulvalo. - Se le tomar ignacia jubilee de orina hoy. - Se puede reiniciar la atorvastatina para el colesterol. Si siente que las palpitaciones empeoran, suspenda el [place]. - Me vacun contra la gripe hoy  Seguimiento: 1-2 meses.  Si tiene alguna pregunta o inquietud, llame a la clnica de medicina interna al [phone].    Thank you, [name] for allowing us  to provide your care today. Today we discussed:  -Heart monitor ordered and will be shipped to you. Wear for 2 weeks and send monitor back.  -Urine sample today. -Can restart atorvastatin  for cholesterol. If you feel palpitations are worse then stop again. -Received flu shot today     Follow up: 1-2 months   Should you have any questions or concerns please call the internal medicine clinic at [phone].    Sharetta Ricchio, D.O. [facility]

## 2024-01-07 NOTE — Assessment & Plan Note (Signed)
 LDL 99 on 09/2023.  Prescribed atorvastatin  20 mg daily.  Patient did stop this medication in the setting of palpitations and reports decreased occurrence.  Shared decision making with decision to restart atorvastatin  and monitor for symptoms.

## 2024-01-08 DIAGNOSIS — R002 Palpitations: Secondary | ICD-10-CM | POA: Insufficient documentation

## 2024-01-08 LAB — MICROALBUMIN / CREATININE URINE RATIO
Creatinine, Urine: 16 mg/dL
Microalb/Creat Ratio: <19 mg/g{creat} (ref 0–29)
Microalbumin, Urine: <3 ug/mL

## 2024-01-08 NOTE — Progress Notes (Signed)
 Internal Medicine Clinic Attending  Case discussed with the resident at the time of the visit.  We reviewed the resident's history and exam and pertinent patient test results.  I agree with the assessment, diagnosis, and plan of care documented in the resident's note.

## 2024-01-08 NOTE — Assessment & Plan Note (Signed)
 For ED visit on 9/17.  Reports at least 1 month of heart palpitations that she has noticed.  Denies chest pain, dyspnea, syncope or LOC, vision changes or leg swelling.  Palpitations have been at random times.  Reports increase in exercise activity recently.  Labs done at ED showed normal TSH and CMP.  EKG at that time showed sinus rhythm with PVC.  No arrhythmia noted then. Patient states she stopped her atorvastatin  and reports decreased recurrence of palpitations.  No other history of cardiac disease.  Cardiovascular exam today with regular rate and rhythm and no murmurs appreciated.  Of note, she now has CAFA for financial assistance.  Plan -Zio patch for 2 weeks -Plan follow-up in 6-8 weeks

## 2024-01-09 ENCOUNTER — Ambulatory Visit: Payer: Self-pay | Admitting: Student

## 2024-01-13 ENCOUNTER — Encounter: Payer: Self-pay | Admitting: Student

## 2024-02-08 DIAGNOSIS — R002 Palpitations: Secondary | ICD-10-CM

## 2024-02-11 ENCOUNTER — Ambulatory Visit (INDEPENDENT_AMBULATORY_CARE_PROVIDER_SITE_OTHER): Payer: Self-pay | Admitting: Student

## 2024-02-11 ENCOUNTER — Encounter: Payer: Self-pay | Admitting: Student

## 2024-02-11 ENCOUNTER — Other Ambulatory Visit: Payer: Self-pay

## 2024-02-11 VITALS — BP 133/75 | HR 78 | Temp 98.2°F | Ht 65.0 in | Wt 194.4 lb

## 2024-02-11 DIAGNOSIS — Z3009 Encounter for other general counseling and advice on contraception: Secondary | ICD-10-CM

## 2024-02-11 DIAGNOSIS — Z833 Family history of diabetes mellitus: Secondary | ICD-10-CM

## 2024-02-11 DIAGNOSIS — E1165 Type 2 diabetes mellitus with hyperglycemia: Secondary | ICD-10-CM

## 2024-02-11 DIAGNOSIS — R002 Palpitations: Secondary | ICD-10-CM

## 2024-02-11 DIAGNOSIS — Z23 Encounter for immunization: Secondary | ICD-10-CM

## 2024-02-11 DIAGNOSIS — Z79899 Other long term (current) drug therapy: Secondary | ICD-10-CM

## 2024-02-11 DIAGNOSIS — L235 Allergic contact dermatitis due to other chemical products: Secondary | ICD-10-CM

## 2024-02-11 DIAGNOSIS — E785 Hyperlipidemia, unspecified: Secondary | ICD-10-CM

## 2024-02-11 MED ORDER — LORATADINE 10 MG PO TABS: 10.0000 mg | ORAL_TABLET | Freq: Every day | ORAL | 2 refills | Status: AC

## 2024-02-11 MED ORDER — DIPHENHYDRAMINE HCL 2 % EX GEL: CUTANEOUS | 1 refills | Status: AC

## 2024-02-11 NOTE — Progress Notes (Deleted)
 CC: ***  HPI: [name] is a 43 y.o. female living with a history stated below and presents today for ***. Please see problem based assessment and plan for additional details.  Past Medical History:  Diagnosis Date   BMI 40.0-44.9, adult (HCC) 08/12/2019   Diabetes (HCC)    HSIL (high grade squamous intraepithelial lesion) on Pap smear of cervix     Current Outpatient Medications on File Prior to Visit  Medication Sig Dispense Refill   atorvastatin  (LIPITOR) 20 MG tablet Take 1 tablet (20 mg total) by mouth daily. 90 tablet 3   Cholecalciferol (VITAMIN D3) 50 MCG (2000 UT) capsule Take 1 capsule (2,000 Units total) by mouth daily. (Patient not taking: Reported on 10/31/2023) 30 capsule 5   ibuprofen  (ADVIL ) 200 MG tablet Take 200 mg by mouth every 6 (six) hours as needed for mild pain (pain score 1-3).     Multiple Vitamin (MULTIVITAMIN) tablet Take 1 tablet by mouth daily.     norethindrone -ethinyl estradiol -FE (LOESTRIN  FE) 1-20 MG-MCG tablet Take 1 tablet by mouth daily. (Patient not taking: Reported on 09/13/2023) 28 tablet 11   No current facility-administered medications on file prior to visit.    Family History  Problem Relation Age of Onset   Diabetes Mother    Hypertension Mother    Heart disease Mother    Lung cancer Father    Breast cancer Neg Hx     Social History   Socioeconomic History   Marital status: Married    Spouse name: Ezequiel   Number of children: 3   Years of education: Not on file   Highest education level: Not on file  Occupational History   Not on file  Tobacco Use   Smoking status: Never   Smokeless tobacco: Never  Vaping Use   Vaping status: Never Used  Substance and Sexual Activity   Alcohol use: Not Currently    Comment: rarely   Drug use: Never   Sexual activity: Yes    Birth control/protection: None  Other Topics Concern   Not on file  Social History Narrative   Not on file   Social Drivers of Health    Financial Resource Strain: Not on file  Food Insecurity: No Food Insecurity (10/31/2023)   Hunger Vital Sign    Worried About Running Out of Food in the Last Year: Never true    Ran Out of Food in the Last Year: Never true  Transportation Needs: No Transportation Needs (10/31/2023)   PRAPARE - Administrator, Civil Service (Medical): No    Lack of Transportation (Non-Medical): No  Physical Activity: Not on file  Stress: Not on file  Social Connections: Not on file  Intimate Partner Violence: Not on file    Review of Systems: ROS negative except for what is noted on the assessment and plan.  There were no vitals filed for this visit.  Physical Exam  Physical Exam: Constitutional: well-appearing *** sitting in ***, in no acute distress HENT: normocephalic atraumatic, mucous membranes moist Eyes: conjunctiva non-erythematous Cardiovascular: regular rate and rhythm, no m/r/g Pulmonary/Chest: normal work of breathing on room air, lungs clear to auscultation bilaterally Abdominal: soft, non-tender, non-distended MSK: *** Neurological: alert & oriented x 3, 5/5 strength in bilateral upper and lower extremities, normal gait Skin: warm and dry Psych: ***  Assessment & Plan:   Assessment & Plan     No orders of the defined types were placed in this encounter.  PMH:  HTN, diabetes, CIN2, palpitations, HLD  Palpitations Completed Zio monitor.   Patch Wear Time:  13 days and 19 hours (2025-10-04T10:53:44-0400 to 2025-10-18T06:45:55-0400)   HR 39 - 129, average 73 bpm. There were no sustained or non-sustained arrhythmias. No atrial fibrillation detected. Rare supraventricular ectopy. Rare ventricular ectopy. Symptom trigger episodes correspond to predominantly sinus rhythm, rare ectopy.     No follow-ups on file.   Patient {[id number]::discussed with,seen with} Dr. {[id number]::Tpoopjfd,Z.  Hoffman,Winfrey,Narendra,Chun,Chambliss,Lau,Machen}  Ozell Nearing, D.O. [facility] Internal Medicine, PGY-3 Clinic Phone: [phone] Date 02/11/2024 Time 8:08 AM

## 2024-02-11 NOTE — Assessment & Plan Note (Addendum)
 Diabetic foot exam performed today, sensations intact. Normal bilateral dorsalis pedis pulses.   Plan:  -follow up at end of the year/early next year for diabetes management and A1C check  -optho referral in place

## 2024-02-11 NOTE — Patient Instructions (Addendum)
 Gracias, Sra. Davia J. Gonzlez-Rico, por permitirnos atenderla hoy. Hoy hablamos sobre:  - Tomar Claritin 10 mg una vez al da para la erupcin.  - Aplicar gel de Benadryl  de 3 a 4 veces al da para la picazn.  - Por favor, no contine usando el detergente que le caus la erupcin.  - No se le realizaron anlisis de sangre hoy.  - Se le remiti a un gineclogo para la colocacin de un DIU.  - Nos alegra saber que sus palpitaciones han mejorado. Como hablamos, pospondremos el ecocardiograma, pero podemos revisarlo ms adelante.   I have ordered the following medication/changed the following medications:   Start the following medications: Meds ordered this encounter  Medications   loratadine (CLARITIN) 10 MG tablet    Sig: Take 1 tablet (10 mg total) by mouth daily.    Dispense:  30 tablet    Refill:  2   dIPHENHYDRAMINE  2 % GEL topical gel    Sig: Follow instructions on package. Apply a thin layer to the affected area up to 3 or 4 times a day.    Dispense:  103 mL    Refill:  1     Seguimiento: 1-2 meses  Si experimenta un empeoramiento de las palpitaciones, dolor de pecho, dificultad para respirar, mareo, vrtigo o [place], acuda a urgencias o llame a la consulta. Llame a la consulta si la erupcin cutnea o la picazn no mejoran o empeoran despus de astronomer.  Si tiene alguna pregunta o inquietud, llame a la clnica de medicina interna al [phone].  Michael Zheng, D.O.  [facility]

## 2024-02-11 NOTE — Assessment & Plan Note (Addendum)
 Patient taking atorvastatin  20 mg about 2-3 times a week. Patient is agreeable to start taking medication everyday as tolerated.   Plan:  -lipid panel during next visit

## 2024-02-11 NOTE — Assessment & Plan Note (Addendum)
 Palpitations have improved from last visit. Denies chest pain, SOB, syncope, lightheadedness, or dizziness. No murmurs, rubs, or gallops on physical exam. TSH, T4, and electrolytes normal from September. EKG from September also resulted in normal sinus rhythm.   Patient completed 2 weeks of Zio monitor use:    Patch Wear Time:  13 days and 19 hours (2025-10-04T10:53:44-0400 to 2025-10-18T06:45:55-0400)   HR 39 - 129, average 73 bpm. There were no sustained or non-sustained arrhythmias. No atrial fibrillation detected. Rare supraventricular ectopy. Rare ventricular ectopy. Symptom trigger episodes correspond to predominantly sinus rhythm, rare ectopy.   Palpitations likely due to a combination of stress and caffeine intake. Discussed ordering an echo with patient to determine if there were any structural causes for the palpitations. Patient declined through shared decision making but is aware that the echo is still an option. Will continue to monitor. Return precautions discussed with patient.

## 2024-02-11 NOTE — Progress Notes (Signed)
 This is a Psychologist, Occupational Note.  The care of the patient was discussed with Dr. Elicia and the assessment and plan was formulated with their assistance.  Please see their note for official documentation of the patient encounter.   Subjective:   Patient ID: Debbie Strickland female   DOB: [birth year] 43 y.o.   MRN: [id number]  HPI: Debbie Strickland is a 43 y.o. with pmh of T2DM, hyperlipidemia, and palpitations who presents to the clinic for follow up. Patient presented with an acute concern today for rash. Patient states that she changed her laundry detergent 22 days ago and used the detergent to wash a blanket. She then put the blanket around her neck to transfer it to air dry, and ever since then she developed an itchy rash around her neck. The rash has not had any pus, bleeding, drainage, or blisters. The rash did start out red, but patient used what she believed to be aloe gel that helped with the redness, but did not help with the itching. Patient has not tried anything else to help relieve rash. Patient endorses that the rash only itches sometimes at random. Patient denies any pain, and no esophageal closing or shortness of breath. Patient does not have rash or itching anywhere else.   Patient has also experienced palpitations that occur about once or twice a week. These palpitations have improved from last visit; she used to get palpitations every day. Patient did endorse decreasing coffee intake to once or twice a week since she started experiencing palpitations. Patient also reported that she was going through a stressful time with her daughter when the palpitations started. Patient denies any chest pain, SOB, syncope, lightheadedness, or dizziness.     Past Medical History:  Diagnosis Date   BMI 40.0-44.9, adult (HCC) 08/12/2019   Diabetes (HCC)    HSIL (high grade squamous intraepithelial lesion) on Pap smear of cervix    Current Outpatient Medications  Medication Sig  Dispense Refill   atorvastatin  (LIPITOR) 20 MG tablet Take 1 tablet (20 mg total) by mouth daily. 90 tablet 3   Cholecalciferol (VITAMIN D3) 50 MCG (2000 UT) capsule Take 1 capsule (2,000 Units total) by mouth daily. (Patient not taking: Reported on 10/31/2023) 30 capsule 5   ibuprofen  (ADVIL ) 200 MG tablet Take 200 mg by mouth every 6 (six) hours as needed for mild pain (pain score 1-3).     Multiple Vitamin (MULTIVITAMIN) tablet Take 1 tablet by mouth daily.     norethindrone -ethinyl estradiol -FE (LOESTRIN  FE) 1-20 MG-MCG tablet Take 1 tablet by mouth daily. (Patient not taking: Reported on 09/13/2023) 28 tablet 11   No current facility-administered medications for this visit.   Family History  Problem Relation Age of Onset   Diabetes Mother    Hypertension Mother    Heart disease Mother    Lung cancer Father    Breast cancer Neg Hx    Social History   Socioeconomic History   Marital status: Married    Spouse name: Ezequiel   Number of children: 3   Years of education: Not on file   Highest education level: Not on file  Occupational History   Not on file  Tobacco Use   Smoking status: Never   Smokeless tobacco: Never  Vaping Use   Vaping status: Never Used  Substance and Sexual Activity   Alcohol use: Not Currently    Comment: rarely   Drug use: Never   Sexual activity: Yes    Birth  control/protection: None  Other Topics Concern   Not on file  Social History Narrative   Not on file   Social Drivers of Health   Financial Resource Strain: Not on file  Food Insecurity: No Food Insecurity (10/31/2023)   Hunger Vital Sign    Worried About Running Out of Food in the Last Year: Never true    Ran Out of Food in the Last Year: Never true  Transportation Needs: No Transportation Needs (10/31/2023)   PRAPARE - Administrator, Civil Service (Medical): No    Lack of Transportation (Non-Medical): No  Physical Activity: Not on file  Stress: Not on file  Social  Connections: Not on file   Review of Systems: Pertinent items are noted in HPI. Objective:  Physical Exam: Vitals:   02/11/24 1424  BP: 133/75  Pulse: 78  Temp: 98.2 F (36.8 C)  TempSrc: Oral  SpO2: 99%  Weight: 194 lb 6.4 oz (88.2 kg)  Height: 5' 5 (1.651 m)   Physical Exam Constitutional:      Appearance: Normal appearance.  HENT:     Head: Normocephalic and atraumatic.  Neck:     Comments: Rash on bilateral sides of neck with minimal surrounding erythema on the left side. Rash is hyperpigmented with small bumps Cardiovascular:     Rate and Rhythm: Normal rate and regular rhythm.     Heart sounds: Normal heart sounds. No murmur heard.    No friction rub. No gallop.     Comments: Normal dorsalis pedis pulses  Pulmonary:     Effort: Pulmonary effort is normal.     Breath sounds: Normal breath sounds.  Neurological:     General: No focal deficit present.     Mental Status: She is alert.  Psychiatric:        Mood and Affect: Mood normal.        Behavior: Behavior normal.      Assessment & Plan:   Assessment & Plan Allergic dermatitis due to other chemical product Patient presented to clinic with pruritic hyperpigmented rash on bilateral neck. Minimal erythema, no blisters, pus, bleeding, or drainage. No esophageal closing or shortness of breath. Most likely allergic contact dermatitis due to symptoms starting when using a new detergent.   Plan:  -Claritin 10 mg once daily for rash and itching  -Benadryl  cream up to 3-4 times daily for itching -Counseled patient to stop detergent use   Type 2 diabetes mellitus with hyperglycemia, without long-term current use of insulin (HCC) Diabetic foot exam performed today, sensations intact. Normal bilateral dorsalis pedis pulses.   Plan:  -follow up at end of the year/early next year for diabetes management and A1C check  -optho referral in place  Palpitations Palpitations have improved from last visit. Denies chest  pain, SOB, syncope, lightheadedness, or dizziness. No murmurs, rubs, or gallops on physical exam. TSH, T4, and electrolytes normal from September. EKG from September also resulted in normal sinus rhythm.   Patient completed 2 weeks of Zio monitor use:    Patch Wear Time:  13 days and 19 hours (2025-10-04T10:53:44-0400 to 2025-10-18T06:45:55-0400)   HR 39 - 129, average 73 bpm. There were no sustained or non-sustained arrhythmias. No atrial fibrillation detected. Rare supraventricular ectopy. Rare ventricular ectopy. Symptom trigger episodes correspond to predominantly sinus rhythm, rare ectopy.   Palpitations likely due to a combination of stress and caffeine intake. Discussed ordering an echo with patient to determine if there were any structural causes for the palpitations.  Patient declined through shared decision making but is aware that the echo is still an option. Will continue to monitor. Return precautions discussed with patient.  Hyperlipidemia, unspecified hyperlipidemia type Patient taking atorvastatin  20 mg about 2-3 times a week. Patient is agreeable to start taking medication everyday as tolerated.   Plan:  -lipid panel during next visit    Immunization due Pneumococcal vaccination given. Consider offering Covid vaccination at next visit.   Orders:   Pneumococcal conjugate vaccine 20-valent (Prevnar 20)  Encounter for counseling regarding contraception Patient interested in contraception. Most recent menstrual period started yesterday (11/2), and usually lasts for 5-6 days. Patient is still sexually active. Patient used to receive Depot injections, but stopped awhile ago. Patient denies any history of smoking, endometrial cancer, DVTs, or pulmonary embolisms. Patient interested in IUD placement   Plan:  -referral to [facility] for IUD placement      Discussed and evaluated with Dr. Elicia and discussed plan with Dr. Shawn.    Cozetta Pereyra, MS3

## 2024-02-12 NOTE — Progress Notes (Signed)
 Internal Medicine Clinic Attending  Case discussed with the resident at the time of the visit.  We reviewed the resident's history and exam and pertinent patient test results.  I agree with the assessment, diagnosis, and plan of care documented in the resident's note.

## 2024-04-13 ENCOUNTER — Ambulatory Visit (INDEPENDENT_AMBULATORY_CARE_PROVIDER_SITE_OTHER): Payer: Self-pay

## 2024-04-13 VITALS — BP 124/87 | HR 74 | Temp 98.2°F | Ht 65.0 in | Wt 197.6 lb

## 2024-04-13 DIAGNOSIS — E7849 Other hyperlipidemia: Secondary | ICD-10-CM

## 2024-04-13 DIAGNOSIS — E669 Obesity, unspecified: Secondary | ICD-10-CM | POA: Insufficient documentation

## 2024-04-13 DIAGNOSIS — E119 Type 2 diabetes mellitus without complications: Secondary | ICD-10-CM

## 2024-04-13 DIAGNOSIS — Z6832 Body mass index (BMI) 32.0-32.9, adult: Secondary | ICD-10-CM

## 2024-04-13 DIAGNOSIS — E1165 Type 2 diabetes mellitus with hyperglycemia: Secondary | ICD-10-CM

## 2024-04-13 DIAGNOSIS — E785 Hyperlipidemia, unspecified: Secondary | ICD-10-CM

## 2024-04-13 LAB — POCT GLYCOSYLATED HEMOGLOBIN (HGB A1C): HbA1c, POC (controlled diabetic range): 6.1 % (ref 0.0–7.0)

## 2024-04-13 LAB — GLUCOSE, CAPILLARY: Glucose-Capillary: 122 mg/dL — ABNORMAL HIGH (ref 70–99)

## 2024-04-13 MED ORDER — ATORVASTATIN CALCIUM 20 MG PO TABS: 20.0000 mg | ORAL_TABLET | Freq: Every day | ORAL | 3 refills | Status: AC

## 2024-04-13 NOTE — Assessment & Plan Note (Signed)
 Type 2 diabetes currently well controlled with lifestyle modification and diet. Patient denies polyuria, polydipsia, neuropathic symptoms, or vision changes. A1c today: 6.1.  Plan: Continue diet and lifestyle management POC A1C and capillary glucose checked today Follow up in 6 months

## 2024-04-13 NOTE — Progress Notes (Signed)
 "  CC: Follow-up for type 2 diabetes mellitus, hyperlipidemia, and routine care.  HPI: Ms. Debbie Strickland is a 44 year old female living with the history listed below who presents today for routine follow-up of her chronic medical conditions. She reports overall good health and denies any acute complaints today.  Her type 2 diabetes and blood pressure have remained well controlled with lifestyle modification and dietary changes. She reports good adherence to healthy eating and regular physical activity. She is currently taking atorvastatin  20 mg daily for hyperlipidemia and denies medication side effects. She has no chest pain, shortness of breath, palpitations, dizziness, or syncope. She denies polyuria, polydipsia, neuropathic symptoms, or vision changes.  Please see the problem-based assessment and plan for additional details.  Past Medical History:  Diagnosis Date   BMI 40.0-44.9, adult (HCC) 08/12/2019   Diabetes (HCC)    HSIL (high grade squamous intraepithelial lesion) on Pap smear of cervix     Medications Ordered Prior to Encounter[1]  Family History  Problem Relation Age of Onset   Diabetes Mother    Hypertension Mother    Heart disease Mother    Lung cancer Father    Breast cancer Neg Hx     Social History   Socioeconomic History   Marital status: Married    Spouse name: Ezequiel   Number of children: 3   Years of education: Not on file   Highest education level: Not on file  Occupational History   Not on file  Tobacco Use   Smoking status: Never   Smokeless tobacco: Never  Vaping Use   Vaping status: Never Used  Substance and Sexual Activity   Alcohol use: Not Currently    Comment: rarely   Drug use: Never   Sexual activity: Yes    Birth control/protection: None  Other Topics Concern   Not on file  Social History Narrative   Not on file   Social Drivers of Health   Tobacco Use: Low Risk (02/11/2024)   Patient History    Smoking Tobacco  Use: Never    Smokeless Tobacco Use: Never    Passive Exposure: Not on file  Financial Resource Strain: Not on file  Food Insecurity: No Food Insecurity (02/11/2024)   Epic    Worried About Programme Researcher, Broadcasting/film/video in the Last Year: Never true    Ran Out of Food in the Last Year: Never true  Transportation Needs: No Transportation Needs (02/11/2024)   Epic    Lack of Transportation (Medical): No    Lack of Transportation (Non-Medical): No  Physical Activity: Not on file  Stress: Not on file  Social Connections: Not on file  Intimate Partner Violence: Not At Risk (02/11/2024)   Epic    Fear of Current or Ex-Partner: No    Emotionally Abused: No    Physically Abused: No    Sexually Abused: No  Depression (PHQ2-9): Low Risk (02/11/2024)   Depression (PHQ2-9)    PHQ-2 Score: 0  Alcohol Screen: Not on file  Housing: Unknown (02/11/2024)   Epic    Unable to Pay for Housing in the Last Year: No    Number of Times Moved in the Last Year: Not on file    Homeless in the Last Year: No  Utilities: Not At Risk (02/11/2024)   Epic    Threatened with loss of utilities: No  Health Literacy: Not on file    Review of Systems: ROS negative except for what is noted on the assessment and  plan.  Vitals:   04/13/24 1418  BP: 124/87  Pulse: 74  Temp: 98.2 F (36.8 C)  TempSrc: Oral  SpO2: 100%  Weight: 197 lb 9.6 oz (89.6 kg)  Height: 5' 5 (1.651 m)    Physical Exam  Physical Exam: Constitutional: well-appearing female sitting comfortably in chair, in no acute distress HENT: normocephalic, atraumatic, mucous membranes moist Eyes: conjunctiva non-erythematous Neck: supple Cardiovascular: regular rate and rhythm, no murmurs, rubs, or gallops Pulmonary/Chest: normal work of breathing on room air, lungs clear to auscultation bilaterally Abdominal: soft, non-tender, non-distended MSK: no joint swelling or tenderness, normal range of motion Neurological: alert and oriented x3, 5/5 strength in  bilateral upper and lower extremities, normal gait Skin: warm and dry Psych: normal mood and affect, appropriate behavior  Assessment & Plan:   Diabetes (HCC) Type 2 diabetes currently well controlled with lifestyle modification and diet. Patient denies polyuria, polydipsia, neuropathic symptoms, or vision changes. A1c today: 6.1.  Plan: Continue diet and lifestyle management POC A1C and capillary glucose checked today Follow up in 6 months  Hyperlipidemia On atorvastatin  20 mg daily. Tolerating medication without side effects. Adherence improved.  Plan: Continue atorvastatin  20 mg daily Lipid panel obtained today Reinforce medication adherence  Obesity (BMI 30-39.9) BMI 32.9. No obesity-related complications reported today. Plan: Encourage continued dietary modification Encourage regular physical activity Discuss weight management strategies at future visits   Patient discussed with Dr. Lovie Armando Rossetti M.D [facility] Internal Medicine, PGY-1 Phone: [phone] Date 04/13/2024 Time 4:20 PM      [1]  Current Outpatient Medications on File Prior to Visit  Medication Sig Dispense Refill   Cholecalciferol (VITAMIN D3) 50 MCG (2000 UT) capsule Take 1 capsule (2,000 Units total) by mouth daily. (Patient not taking: Reported on 10/31/2023) 30 capsule 5   dIPHENHYDRAMINE  2 % GEL topical gel Follow instructions on package. Apply a thin layer to the affected area up to 3 or 4 times a day. 103 mL 1   ibuprofen  (ADVIL ) 200 MG tablet Take 200 mg by mouth every 6 (six) hours as needed for mild pain (pain score 1-3).     loratadine  (CLARITIN ) 10 MG tablet Take 1 tablet (10 mg total) by mouth daily. 30 tablet 2   Multiple Vitamin (MULTIVITAMIN) tablet Take 1 tablet by mouth daily.     norethindrone -ethinyl estradiol -FE (LOESTRIN  FE) 1-20 MG-MCG tablet Take 1 tablet by mouth daily. (Patient not taking: Reported on 09/13/2023) 28 tablet 11   No current facility-administered  medications on file prior to visit.   "

## 2024-04-13 NOTE — Patient Instructions (Addendum)
 Thank you, Ms. Tully Debbie Strickland, for allowing us  to care for you today. During your visit, we talked about your blood pressure and type 2 diabetes, both of which are currently well controlled through your healthy lifestyle changes and diet. You have been doing a great job, and we encourage you to continue maintaining your balanced diet and regular physical activity. We also reviewed your high cholesterol and your medication, atorvastatin  20 mg, which you are now taking once daily. To see how your efforts and medication are working together, we checked your lipid panel again today to assess for improvement.  I have ordered the following labs for you:  Lab Orders         Glucose, capillary         Lipid Profile         POC Hbg A1C        I have ordered the following medication/changed the following medications:   Stop the following medications: Medications Discontinued During This Encounter  Medication Reason   atorvastatin  (LIPITOR) 20 MG tablet Reorder     Start the following medications: Meds ordered this encounter  Medications   atorvastatin  (LIPITOR) 20 MG tablet    Sig: Take 1 tablet (20 mg total) by mouth daily.    Dispense:  90 tablet    Refill:  3    IM program     Follow up: 6 month   Remember:   Should you have any questions or concerns please call the internal medicine clinic at [phone].    Armando Rossetti, M.D [facility]  ---------------------------------------------------------------------------- Debbie Strickland, Debbie Strickland, por permitirnos brindarle su atencin hoy. Durante su visita, hablamos sobre su presin arterial y su diabetes tipo 2, ambas actualmente bien controladas mediante cambios saludables en su estilo de vida y su alimentacin. Ha estado haciendo un excelente [place], y la animamos a que contine manteniendo una dieta balanceada y actividad fsica regular. Tambin revisamos su nivel alto de colesterol y  su medicamento, atorvastatina 20 mg, el cual ahora est tomando una vez al c[employer]. Para evaluar cmo estn funcionando juntos sus esfuerzos y el medicamento, hoy volvimos a revisar su perfil de lpidos para valorar mejora.  He ordenado los siguientes estudios de laboratorio para usted:  rdenes de laboratorio:  Glucosa capilar  Perfil de lpidos  Hemoglobina A1C (POC)  He realizado los siguientes cambios en sus medicamentos:  Suspender el siguiente medicamento:  Atorvastatina (Lipitor) 20 mg Motivo: renovacin de la receta  Iniciar el siguiente medicamento:  Atorvastatina (Lipitor) 20 mg Tome 1 tableta (20 mg) por va oral una vez al da. Cantidad: 90 tabletas Refil: 3  Seguimiento:  Cita de seguimiento en 6 meses  Recuerde:  Si tiene alguna pregunta o inquietud, por favor llame a la clnica de medicina interna al [phone].  Clarion Mooneyhan, M.D. [facility]

## 2024-04-13 NOTE — Assessment & Plan Note (Signed)
 BMI 32.9. No obesity-related complications reported today. Plan: Encourage continued dietary modification Encourage regular physical activity Discuss weight management strategies at future visits

## 2024-04-13 NOTE — Assessment & Plan Note (Signed)
 On atorvastatin  20 mg daily. Tolerating medication without side effects. Adherence improved.  Plan: Continue atorvastatin  20 mg daily Lipid panel obtained today Reinforce medication adherence

## 2024-04-14 LAB — LIPID PANEL
Chol/HDL Ratio: 2.3 ratio (ref 0.0–4.4)
Cholesterol, Total: 148 mg/dL (ref 100–199)
HDL: 63 mg/dL
LDL Chol Calc (NIH): 62 mg/dL (ref 0–99)
Triglycerides: 133 mg/dL (ref 0–149)
VLDL Cholesterol Cal: 23 mg/dL (ref 5–40)

## 2024-04-14 NOTE — Progress Notes (Signed)
 [facility] Attending  Case discussed with the resident at the time of the visit.  We reviewed the residents history and exam and pertinent patient test results.  I agree with the assessment, diagnosis, and plan of care documented in the residents note.   LDL is 62 today - at goal on atorvastatin .

## 2024-04-19 ENCOUNTER — Ambulatory Visit: Payer: Self-pay

## 2024-04-19 NOTE — Progress Notes (Signed)
 Hello Debbie Strickland, your cholesterol results are within the normal range. Please continue your cholesterol medication, as it is helping maintain good control, as long as you are tolerating it without side effects.
